# Patient Record
Sex: Female | Born: 1975 | Race: Black or African American | Hispanic: No | Marital: Married | Smoking: Never smoker
Health system: Southern US, Community
[De-identification: ages and names within clinical notes are randomized; demographics above are authoritative.]

## PROBLEM LIST (undated history)

## (undated) DIAGNOSIS — B379 Candidiasis, unspecified: Secondary | ICD-10-CM

## (undated) DIAGNOSIS — D649 Anemia, unspecified: Secondary | ICD-10-CM

## (undated) DIAGNOSIS — Z800 Family history of malignant neoplasm of digestive organs: Secondary | ICD-10-CM

## (undated) DIAGNOSIS — Z8049 Family history of malignant neoplasm of other genital organs: Secondary | ICD-10-CM

## (undated) DIAGNOSIS — K219 Gastro-esophageal reflux disease without esophagitis: Secondary | ICD-10-CM

## (undated) HISTORY — DX: Candidiasis, unspecified: B37.9

## (undated) HISTORY — DX: Anemia, unspecified: D64.9

## (undated) HISTORY — DX: Gastro-esophageal reflux disease without esophagitis: K21.9

## (undated) HISTORY — DX: Family history of malignant neoplasm of other genital organs: Z80.49

## (undated) HISTORY — DX: Family history of malignant neoplasm of digestive organs: Z80.0

---

## 1999-02-13 ENCOUNTER — Encounter: Payer: Self-pay | Admitting: Emergency Medicine

## 1999-02-13 ENCOUNTER — Emergency Department (HOSPITAL_COMMUNITY): Admission: EM | Admit: 1999-02-13 | Discharge: 1999-02-13 | Payer: Self-pay | Admitting: Emergency Medicine

## 1999-10-28 ENCOUNTER — Other Ambulatory Visit: Admission: RE | Admit: 1999-10-28 | Discharge: 1999-10-28 | Payer: Self-pay | Admitting: Obstetrics and Gynecology

## 2001-03-08 ENCOUNTER — Other Ambulatory Visit: Admission: RE | Admit: 2001-03-08 | Discharge: 2001-03-08 | Payer: Self-pay | Admitting: Obstetrics and Gynecology

## 2002-05-02 ENCOUNTER — Other Ambulatory Visit: Admission: RE | Admit: 2002-05-02 | Discharge: 2002-05-02 | Payer: Self-pay | Admitting: Obstetrics and Gynecology

## 2002-12-06 ENCOUNTER — Inpatient Hospital Stay (HOSPITAL_COMMUNITY): Admission: AD | Admit: 2002-12-06 | Discharge: 2002-12-08 | Payer: Self-pay | Admitting: Obstetrics and Gynecology

## 2002-12-25 ENCOUNTER — Encounter: Admission: RE | Admit: 2002-12-25 | Discharge: 2003-01-24 | Payer: Self-pay | Admitting: Obstetrics and Gynecology

## 2003-01-25 ENCOUNTER — Encounter: Admission: RE | Admit: 2003-01-25 | Discharge: 2003-02-24 | Payer: Self-pay | Admitting: Obstetrics and Gynecology

## 2003-03-27 ENCOUNTER — Encounter: Admission: RE | Admit: 2003-03-27 | Discharge: 2003-04-26 | Payer: Self-pay | Admitting: Obstetrics and Gynecology

## 2003-04-22 ENCOUNTER — Other Ambulatory Visit: Admission: RE | Admit: 2003-04-22 | Discharge: 2003-04-22 | Payer: Self-pay | Admitting: Obstetrics and Gynecology

## 2003-05-23 ENCOUNTER — Encounter: Admission: RE | Admit: 2003-05-23 | Discharge: 2003-06-22 | Payer: Self-pay | Admitting: Obstetrics and Gynecology

## 2003-06-23 ENCOUNTER — Encounter: Admission: RE | Admit: 2003-06-23 | Discharge: 2003-07-23 | Payer: Self-pay | Admitting: Obstetrics and Gynecology

## 2003-07-25 ENCOUNTER — Encounter: Admission: RE | Admit: 2003-07-25 | Discharge: 2003-08-24 | Payer: Self-pay | Admitting: Obstetrics and Gynecology

## 2003-08-25 ENCOUNTER — Encounter: Admission: RE | Admit: 2003-08-25 | Discharge: 2003-09-24 | Payer: Self-pay | Admitting: Obstetrics and Gynecology

## 2004-04-23 ENCOUNTER — Other Ambulatory Visit: Admission: RE | Admit: 2004-04-23 | Discharge: 2004-04-23 | Payer: Self-pay | Admitting: Obstetrics and Gynecology

## 2004-09-12 ENCOUNTER — Emergency Department (HOSPITAL_COMMUNITY): Admission: EM | Admit: 2004-09-12 | Discharge: 2004-09-12 | Payer: Self-pay | Admitting: Family Medicine

## 2005-06-15 ENCOUNTER — Other Ambulatory Visit: Admission: RE | Admit: 2005-06-15 | Discharge: 2005-06-15 | Payer: Self-pay | Admitting: Obstetrics and Gynecology

## 2009-11-20 ENCOUNTER — Inpatient Hospital Stay (HOSPITAL_COMMUNITY): Admission: AD | Admit: 2009-11-20 | Discharge: 2009-11-20 | Payer: Self-pay | Admitting: Obstetrics and Gynecology

## 2009-11-24 ENCOUNTER — Inpatient Hospital Stay (HOSPITAL_COMMUNITY): Admission: AD | Admit: 2009-11-24 | Discharge: 2009-11-24 | Payer: Self-pay | Admitting: Obstetrics and Gynecology

## 2009-12-15 ENCOUNTER — Inpatient Hospital Stay (HOSPITAL_COMMUNITY): Admission: AD | Admit: 2009-12-15 | Discharge: 2009-12-18 | Payer: Self-pay | Admitting: Obstetrics and Gynecology

## 2009-12-16 ENCOUNTER — Encounter (INDEPENDENT_AMBULATORY_CARE_PROVIDER_SITE_OTHER): Payer: Self-pay | Admitting: Obstetrics and Gynecology

## 2010-07-16 LAB — CBC
HCT: 29.7 % — ABNORMAL LOW (ref 36.0–46.0)
HCT: 33.6 % — ABNORMAL LOW (ref 36.0–46.0)
HCT: 36.9 % (ref 36.0–46.0)
Hemoglobin: 10.5 g/dL — ABNORMAL LOW (ref 12.0–15.0)
Hemoglobin: 11.4 g/dL — ABNORMAL LOW (ref 12.0–15.0)
MCH: 22.8 pg — ABNORMAL LOW (ref 26.0–34.0)
MCH: 23.1 pg — ABNORMAL LOW (ref 26.0–34.0)
MCH: 23.8 pg — ABNORMAL LOW (ref 26.0–34.0)
MCHC: 30.8 g/dL (ref 30.0–36.0)
MCHC: 31.2 g/dL (ref 30.0–36.0)
MCHC: 31.8 g/dL (ref 30.0–36.0)
MCV: 74 fL — ABNORMAL LOW (ref 78.0–100.0)
MCV: 74.1 fL — ABNORMAL LOW (ref 78.0–100.0)
Platelets: 293 10*3/uL (ref 150–400)
Platelets: 327 10*3/uL (ref 150–400)
RBC: 4.54 MIL/uL (ref 3.87–5.11)
RBC: 4.98 MIL/uL (ref 3.87–5.11)
RDW: 17.9 % — ABNORMAL HIGH (ref 11.5–15.5)
RDW: 18.4 % — ABNORMAL HIGH (ref 11.5–15.5)
RDW: 18.4 % — ABNORMAL HIGH (ref 11.5–15.5)
WBC: 11.4 10*3/uL — ABNORMAL HIGH (ref 4.0–10.5)
WBC: 8.6 10*3/uL (ref 4.0–10.5)

## 2010-07-16 LAB — RPR: RPR Ser Ql: NONREACTIVE

## 2010-07-16 LAB — DIFFERENTIAL
Basophils Absolute: 0 10*3/uL (ref 0.0–0.1)
Basophils Relative: 0 % (ref 0–1)
Eosinophils Relative: 0 % (ref 0–5)
Lymphocytes Relative: 16 % (ref 12–46)
Monocytes Absolute: 0.6 10*3/uL (ref 0.1–1.0)
Monocytes Relative: 5 % (ref 3–12)

## 2010-09-18 NOTE — H&P (Signed)
NAME:  Taylor, Vance                        ACCOUNT NO.:  [id number]   MEDICAL RECORD NO.:  [id number]                   PATIENT TYPE:  MAT   LOCATION:  [facility]                                 FACILITY:  [facility]   PHYSICIAN:  Janine Limbo, M.D.            DATE OF BIRTH:  [birth year]   DATE OF ADMISSION:  12/06/2002  DATE OF DISCHARGE:                                HISTORY & PHYSICAL   HISTORY OF PRESENT ILLNESS:  [name] is a 35 year old female, gravida 4,  para 1-0-2-1, who presents at 11 1/[redacted] weeks gestation (EDC is November 26, 2002)  for induction of labor. The patient has been followed at the [facility] and Gynecology division of [facility] for this pregnancy that has been complicated by the fact that she has  the trait for hemoglobin C. Her urinalysis has been normal throughout the  pregnancy.   OBSTETRICAL HISTORY:  In 1993, the patient had a vaginal delivery at [redacted]  weeks gestation of a 6 pound 7 ounce female infant. In 1995, the patient had a  first trimester elective pregnancy termination. In 2000, the patient had a  first trimester miscarriage.   ALLERGIES:  No known drug allergies.   PAST MEDICAL HISTORY:  The patient denies hypertension and diabetes.   REVIEW OF SYSTEMS:  Normal pregnancy complaints.   SOCIAL HISTORY:  The patient denies cigarette use, alcohol use, and  recreational drug use. The patient is a vegetarian.   FAMILY HISTORY:  Noncontributory.   PHYSICAL EXAMINATION:  VITAL SIGNS:  Weight is 211 pounds.  HEENT:  Within normal limits.  CHEST:  Clear.  HEART:  Regular rate and rhythm.  BREASTS:  Without masses.  ABDOMEN:  Gravid with a fundal height of 37 cm.  EXTREMITIES:  Within normal limits.  NEUROLOGIC:  Grossly normal.  PELVIC:  Cervix is 3 cm dilated, 75% effaced, and -2 in station. The infant  is vertex.   LABORATORY DATA:  Blood type is A+, antibody screen negative, VDRL  nonreactive, rubella  positive. HBsAg negative. Hemoglobin electrophoresis is  negative. HIV is nonreactive. Cystic fibrosis is negative. GC negative.  Chlamydia negative. Glucola screen within normal limits. Alpha-fetoprotein  screen within normal limits. Third trimester beta strep is negative. Third  trimester gonorrhea and Chlamydia are negative.   ASSESSMENT:  1. 41 1/[redacted] weeks gestation.  2. Favorable cervix.  3. Family history of hemoglobin C trait.   PLAN:  The patient will undergo induction of labor. We will start the  patient on Pitocin and then rupture her membranes.                                               Janine Limbo, M.D.    AVS/MEDQ  D:  12/05/2002  T:  12/05/2002  Job:  [id number]

## 2011-12-28 ENCOUNTER — Ambulatory Visit (INDEPENDENT_AMBULATORY_CARE_PROVIDER_SITE_OTHER): Payer: 59 | Admitting: Obstetrics and Gynecology

## 2011-12-28 ENCOUNTER — Encounter: Payer: Self-pay | Admitting: Obstetrics and Gynecology

## 2011-12-28 VITALS — BP 92/64 | Resp 16 | Ht 66.0 in | Wt 194.0 lb

## 2011-12-28 DIAGNOSIS — Z124 Encounter for screening for malignant neoplasm of cervix: Secondary | ICD-10-CM

## 2011-12-28 NOTE — Progress Notes (Signed)
Regular Periods: yes Mammogram: no  Monthly Breast Ex.: yes Exercise: yes"Jogging 3-4 miles a week"  Tetanus < 10 years: no Seatbelts: yes  NI. Bladder Functn.: yes Abuse at home: no  Daily BM's: yes Stressful Work: no  Healthy Diet: yes Sigmoid-Colonoscopy: N/A  Calcium: no Medical problems this year: Incontinence, pt states she is during [place], with sneezing she notices a little leaking.   LAST PAP:01/2010 "WNL"  Contraception: None  Mammogram:  N/A  PCP: Dr.Kimberly Renae Gloss  PMH: No Changes  FMH: No Changes  Last Bone Scan: Never  ANNUAL GYNECOLOGIC EXAMINATION   Taylor Vance is a 36 y.o. female, No obstetric history on file., who presents for an annual exam. See above. Husband had a vasectomy.  Occasional urinary incontinence.  Prior Hysterectomy: No    History   Social History  . Marital Status: Married    Spouse Name: N/A    Number of Children: N/A  . Years of Education: N/A   Social History Main Topics  . Smoking status: Never Smoker   . Smokeless tobacco: Never Used  . Alcohol Use: No  . Drug Use: No  . Sexually Active: None   Other Topics Concern  . None   Social History Narrative  . None    Menstrual cycle:   LMP: Patient's last menstrual period was 11/29/2011.             The following portions of the patient's history were reviewed and updated as appropriate: allergies, current medications, past family history, past medical history, past social history, past surgical history and problem list.  Review of Systems Pertinent items are noted in HPI. Breast:Negative for breast lump,nipple discharge or nipple retraction Gastrointestinal: Negative for abdominal pain, change in bowel habits or rectal bleeding Urinary:negative   Objective:    BP 92/64  Resp 16  Ht 5\' 6"  (1.676 m)  Wt 194 lb (87.998 kg)  BMI 31.31 kg/m2  LMP 11/29/2011    Weight:  Wt Readings from Last 1 Encounters:  12/28/11 194 lb (87.998 kg)          BMI: Body mass  index is 31.31 kg/(m^2).  General Appearance: Alert, appropriate appearance for age. No acute distress HEENT: Grossly normal Neck / Thyroid: Supple, no masses, nodes or enlargement Lungs: clear to auscultation bilaterally Back: No CVA tenderness Breast Exam: No masses or nodes.No dimpling, nipple retraction or discharge. Cardiovascular: Regular rate and rhythm. S1, S2, no murmur Gastrointestinal: Soft, non-tender, no masses or organomegaly  ++++++++++++++++++++++++++++++++++++++++++++++++++++++++  Pelvic Exam: External genitalia: normal general appearance Vaginal: normal without tenderness, induration or masses and relaxation: Yes Cervix: normal appearance Adnexa: normal bimanual exam Uterus: normal size, shape, and consistency Rectovaginal: no masses  ++++++++++++++++++++++++++++++++++++++++++++++++++++++++  Lymphatic Exam: Non-palpable nodes in neck, clavicular, axillary, or inguinal regions Neurologic: Normal speech, no tremor  Psychiatric: Alert and oriented, appropriate affect.   Wet Prep:   not applicable Urinalysis:  not applicable UPT:           Not done   Assessment:    Normal gyn exam   Overweight or obese: Yes   Pelvic relaxation: Yes  Urinary incontinence   Plan:    pap smear return annually or prn Contraception:vasectomy    Medications prescribed: none  STD screen request: No   The updated Pap smear screening guidelines were discussed with the patient. The patient requested that I obtain a Pap smear: Yes.  Kegel exercises discussed: Yes.  Proper diet and regular exercise were reviewed.  Annual mammograms  recommended starting at age 39. Proper breast care was discussed.  Screening colonoscopy is recommended beginning at age 46.  Regular health maintenance was reviewed.  Sleep hygiene was discussed.  Mylinda Latina.D.

## 2011-12-29 LAB — PAP IG W/ RFLX HPV ASCU

## 2018-02-20 ENCOUNTER — Other Ambulatory Visit: Payer: Self-pay | Admitting: Obstetrics and Gynecology

## 2018-02-20 DIAGNOSIS — N6001 Solitary cyst of right breast: Secondary | ICD-10-CM

## 2018-03-14 ENCOUNTER — Ambulatory Visit
Admission: RE | Admit: 2018-03-14 | Discharge: 2018-03-14 | Disposition: A | Discharge status: Home / Self Care | Payer: 59 | Source: Ambulatory Visit | Attending: Obstetrics and Gynecology | Admitting: Obstetrics and Gynecology

## 2018-03-14 DIAGNOSIS — N6001 Solitary cyst of right breast: Secondary | ICD-10-CM

## 2018-06-01 ENCOUNTER — Ambulatory Visit (INDEPENDENT_AMBULATORY_CARE_PROVIDER_SITE_OTHER): Payer: 59 | Admitting: Sports Medicine

## 2018-06-01 ENCOUNTER — Encounter: Payer: Self-pay | Admitting: Sports Medicine

## 2018-06-01 VITALS — BP 100/80 | HR 73 | Ht 66.0 in | Wt 206.8 lb

## 2018-06-01 DIAGNOSIS — S93492A Sprain of other ligament of left ankle, initial encounter: Secondary | ICD-10-CM

## 2018-06-01 MED ORDER — DICLOFENAC SODIUM 2 % TD SOLN: 1.0000 "application " | Freq: Two times a day (BID) | TRANSDERMAL | 2 refills | Status: DC

## 2018-06-01 MED ORDER — DICLOFENAC SODIUM 2 % TD SOLN: 1.0000 "application " | Freq: Two times a day (BID) | TRANSDERMAL | 0 refills | Status: AC

## 2018-06-01 NOTE — Progress Notes (Signed)
Taylor FellsMichael D. Delorise Shinerigby, DO  [facility] [phone]  Unknown JimMonique Danielle Vance - 43 y.o. female MRN [id number]  Date of birth: 12-19-1975  Visit Date: June 04, 2018  PCP: Taylor Vance   Referred by: Taylor Vance  SUBJECTIVE:  Chief Complaint  Patient presents with  . Left Ankle - Initial Assessment    HPI: Patient reports having an inversion ankle injury on 04/13/2018.  She has been having lateral ankle pain since that time.  Pain is located over the anterior lateral ankle.  Currently described as mild.  It is continuing to improve very slowly.  She initially had a moderate amount of swelling and bruising but this has also improved.  Is worse with prolonged standing and walking.  She is tried resting, heating, icing, elevation and over-the-counter bracing with moderate improvement but continues to have pain especially with activity.  She has not tried taking any medications on a regular basis for this.  It does occasionally awaken her at night.  No other associated falls.  And she does have some swelling but this is mild.  REVIEW OF SYSTEMS: Per HPI  Otherwise 12 point review of systems performed and is negative   HISTORY:  Prior history reviewed and updated per electronic medical record.  Social History   Occupational History  . Not on file  Tobacco Use  . Smoking status: Never Smoker  . Smokeless tobacco: Never Used  Substance and Sexual Activity  . Alcohol use: No  . Drug use: No  . Sexual activity: Not on file   Social History   Social History Narrative  . Not on file   Past Medical History:  Diagnosis Date  . Yeast infection    No past surgical history on file.   OBJECTIVE:  VS:  HT:5\' 6"  (167.6 cm)   WT:206 lb 12.8 oz (93.8 kg)  BMI:33.39    BP:100/80  HR:73bpm  TEMP: ( )  RESP:98 %   PHYSICAL EXAM: Adult female.  In no acute distress.  Alert and appropriate. Left ankle is well aligned.   Ligamentously stable to anterior drawer and talar tilting.  She does have pain however with anterior drawer and focal tenderness to palpation over the anterior lateral ankle.  Small amount of pain with Klieger testing.  Negative cotton test.  Intrinsic ankle strength and range of motion is 5+/5 although some poor proprioception appreciated. No significant lower extremity edema. DP PT pulses 2+/4.    ASSESSMENT   1. Sprain of anterior talofibular ligament of left ankle, initial encounter      PROCEDURES:  None  PLAN:  Pertinent additional documentation may be included in corresponding procedure notes, imaging studies, problem based documentation and patient instructions.  No problem-specific Assessment & Plan notes found for this encounter.  Symptoms are consistent with an inversion ankle injury with persistent slight anterior ankle impingement syndrome. Body Helix Compression Sleeve compression sleeve Home therapeutic exercises Pennsaid prescription and sample provided today.  We did discuss the option for injection therapy but she would like to try topical treatment first.   Continue with icing, cool water compression recommended.  Activity modifications and the importance of avoiding exacerbating activities (limiting pain to no more than a 4 / 10 during or following activity) recommended and discussed.  Discussed red flag symptoms that warrant earlier emergent evaluation and patient voices understanding.   Meds ordered this encounter  Medications  . Diclofenac Sodium (PENNSAID) 2 % SOLN  Sig: Place 1 application onto the skin 2 (two) times daily for 1 day.    Dispense:  8 g    Refill:  0  . Diclofenac Sodium (PENNSAID) 2 % SOLN    Sig: Place 1 application onto the skin 2 (two) times daily.    Dispense:  112 g    Refill:  2    Home Phone      [phone] Work Phone      [phone] Mobile          [phone]    Lab Orders  No laboratory test(s) ordered today    Imaging Orders  No imaging studies ordered today   Referral Orders  No referral(s) requested today   Return in about 4 weeks (around 06/29/2018).   To ensure clinical resolution.          Andrena Mews, DO    [facility]

## 2018-06-01 NOTE — Patient Instructions (Addendum)
Pennsaid instructions: You have been given a sample/prescription for Pennsaid, a topical medication.     You are to apply this gel to your injured body part twice daily (morning and evening).   A little goes a long way so you can use about a pea-sized amount for each area.   Spread this small amount over the area into a thin film and let it dry.   Be sure that you do not rub the gel into your skin for more than 10 or 15 seconds otherwise it can irritate you skin.    Once you apply the gel, please do not put any other lotion or clothing in contact with that area for 30 minutes to allow the gel to absorb into your skin.   Some people are sensitive to the medication and can develop a sunburn-like rash.  If you have only mild symptoms it is okay to continue to use the medication but if you have any breakdown of your skin you should discontinue its use and please let us know.   If you have been written a prescription for Pennsaid, you will receive a pump bottle of this topical gel through a mail order pharmacy.  The instructions on the bottle will say to apply two pumps twice a day which may be too much gel for your particular area so use the pea-sized amount as your guide.   Instructions for Duexis, Pennsaid and Vimovo:  Your prescription will be filled through a participating [facility] mail order pharmacy.  You will receive a phone call or text from one of the participating pharmacies which can be located in any state in the [place].  You must communicate directly with them to have this medication filled.  When the pharmacy contacts you, they will need your mailing address (for shipment of the medication) andy they will need payment information if you have a copay (typically no more than $10). If you have not heard from them 2-3 days after your appointment with Dr. Rigby, contact [facility] directly at [phone].   I recommend you obtained a compression sleeve to help with  your joint problems. There are many options on the market however I recommend obtaining a ankle Body Helix compression sleeve.  You can find information (including how to appropriate measure yourself for sizing) can be found at www.Body Helix.com.  Many of these products are health savings account (HSA) eligible.   You can use the compression sleeve at any time throughout the day but is most important to use while being active as well as for 2 hours post-activity.   It is appropriate to ice following activity with the compression sleeve in place.   

## 2018-06-04 ENCOUNTER — Encounter: Payer: Self-pay | Admitting: Sports Medicine

## 2018-06-04 DIAGNOSIS — S93492A Sprain of other ligament of left ankle, initial encounter: Secondary | ICD-10-CM | POA: Insufficient documentation

## 2018-06-29 ENCOUNTER — Ambulatory Visit: Payer: 59 | Admitting: Sports Medicine

## 2019-08-20 ENCOUNTER — Other Ambulatory Visit: Payer: Self-pay | Admitting: Obstetrics and Gynecology

## 2019-08-20 DIAGNOSIS — N63 Unspecified lump in unspecified breast: Secondary | ICD-10-CM

## 2019-08-30 ENCOUNTER — Other Ambulatory Visit: Payer: Self-pay

## 2019-08-30 ENCOUNTER — Ambulatory Visit
Admission: RE | Admit: 2019-08-30 | Discharge: 2019-08-30 | Disposition: A | Discharge status: Home / Self Care | Payer: 59 | Source: Ambulatory Visit | Attending: Obstetrics and Gynecology | Admitting: Obstetrics and Gynecology

## 2019-08-30 ENCOUNTER — Other Ambulatory Visit: Payer: Self-pay | Admitting: Obstetrics and Gynecology

## 2019-08-30 DIAGNOSIS — R921 Mammographic calcification found on diagnostic imaging of breast: Secondary | ICD-10-CM

## 2019-08-30 DIAGNOSIS — N63 Unspecified lump in unspecified breast: Secondary | ICD-10-CM

## 2019-09-05 ENCOUNTER — Ambulatory Visit
Admission: RE | Admit: 2019-09-05 | Discharge: 2019-09-05 | Disposition: A | Discharge status: Home / Self Care | Payer: 59 | Source: Ambulatory Visit | Attending: Obstetrics and Gynecology | Admitting: Obstetrics and Gynecology

## 2019-09-05 ENCOUNTER — Other Ambulatory Visit: Payer: Self-pay

## 2019-09-05 DIAGNOSIS — R921 Mammographic calcification found on diagnostic imaging of breast: Secondary | ICD-10-CM

## 2021-11-04 IMAGING — MG MM BREAST LOCALIZATION CLIP
4 series · 4 of 12 positions shown · non-contrast
Comparison: Previous exams.

CLINICAL DATA: Post stereotactic guided biopsy of calcifications in
the lower central to slightly inner left breast.

EXAM:
DIAGNOSTIC LEFT MAMMOGRAM POST STEREOTACTIC BIOPSY

[L CC synth-2D]
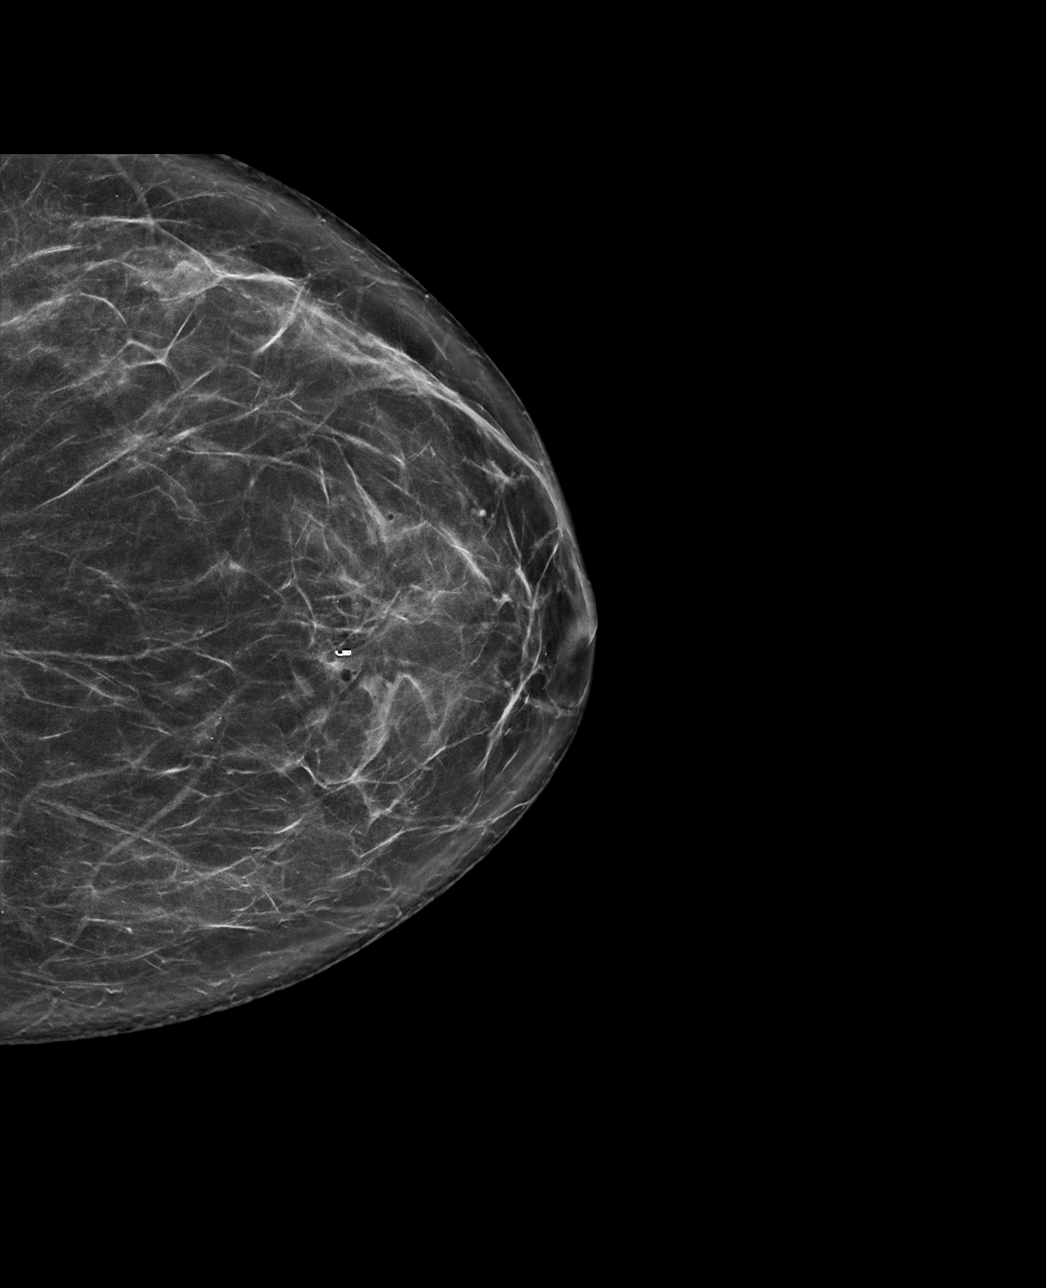

[L ML synth-2D]
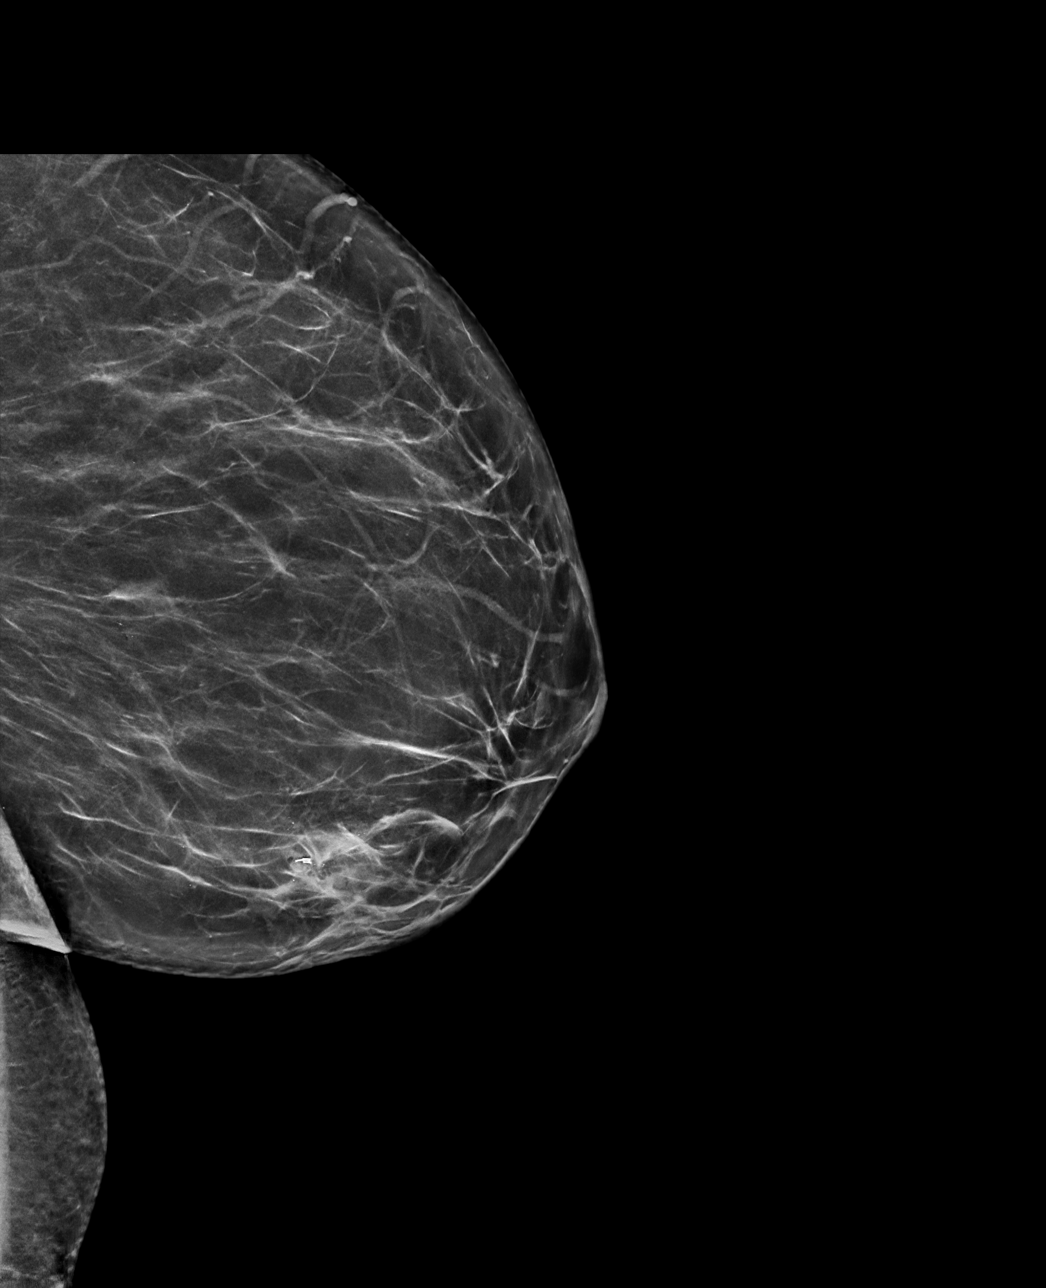

[L CC tomo · tomo slice 37/74.0]
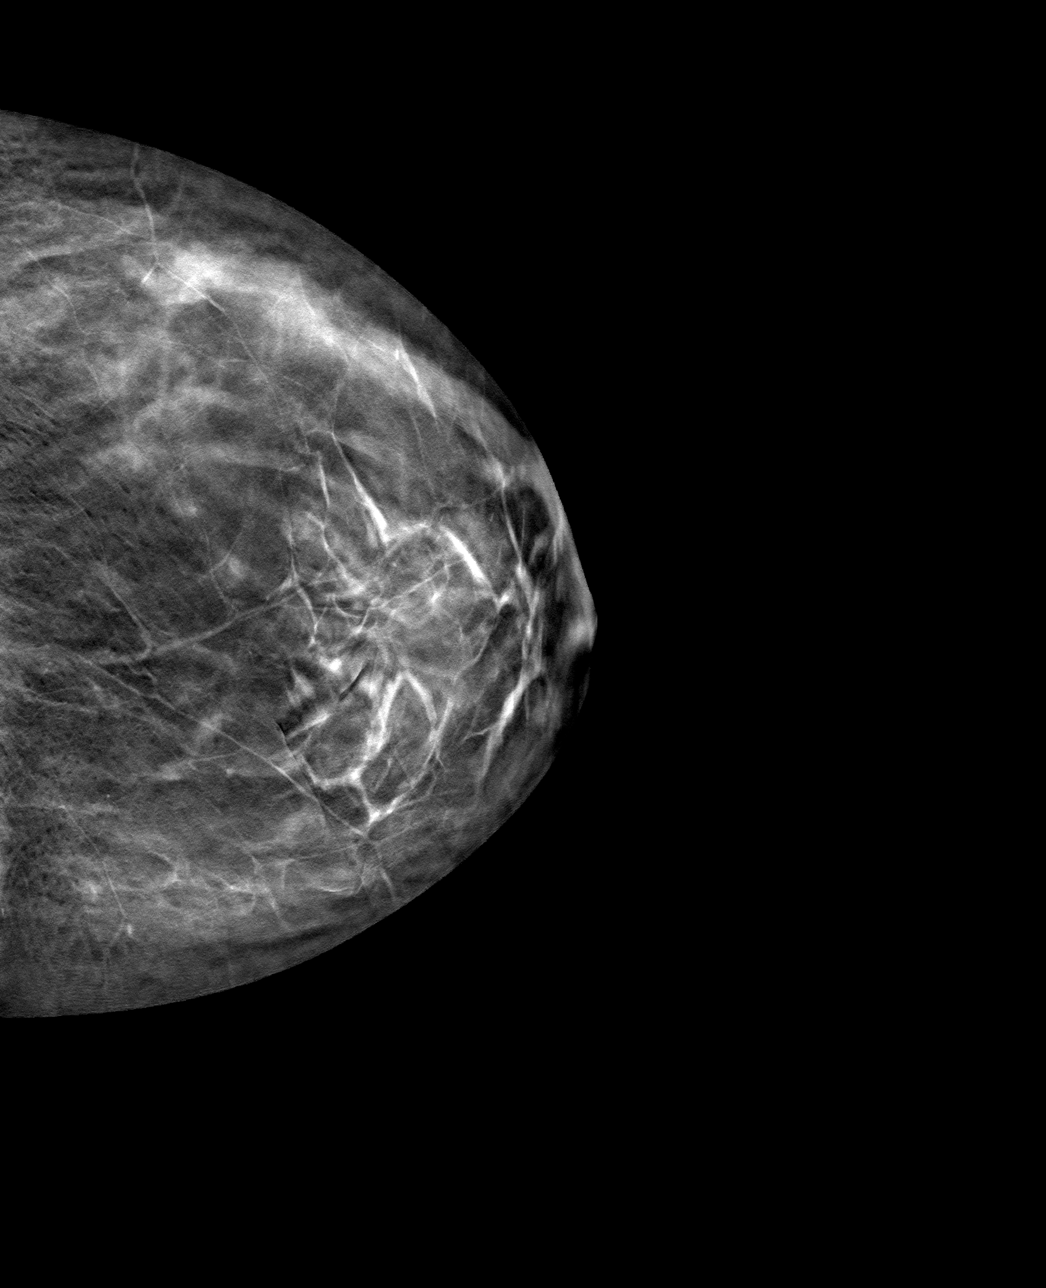

[L ML tomo · tomo slice 39/76.0]
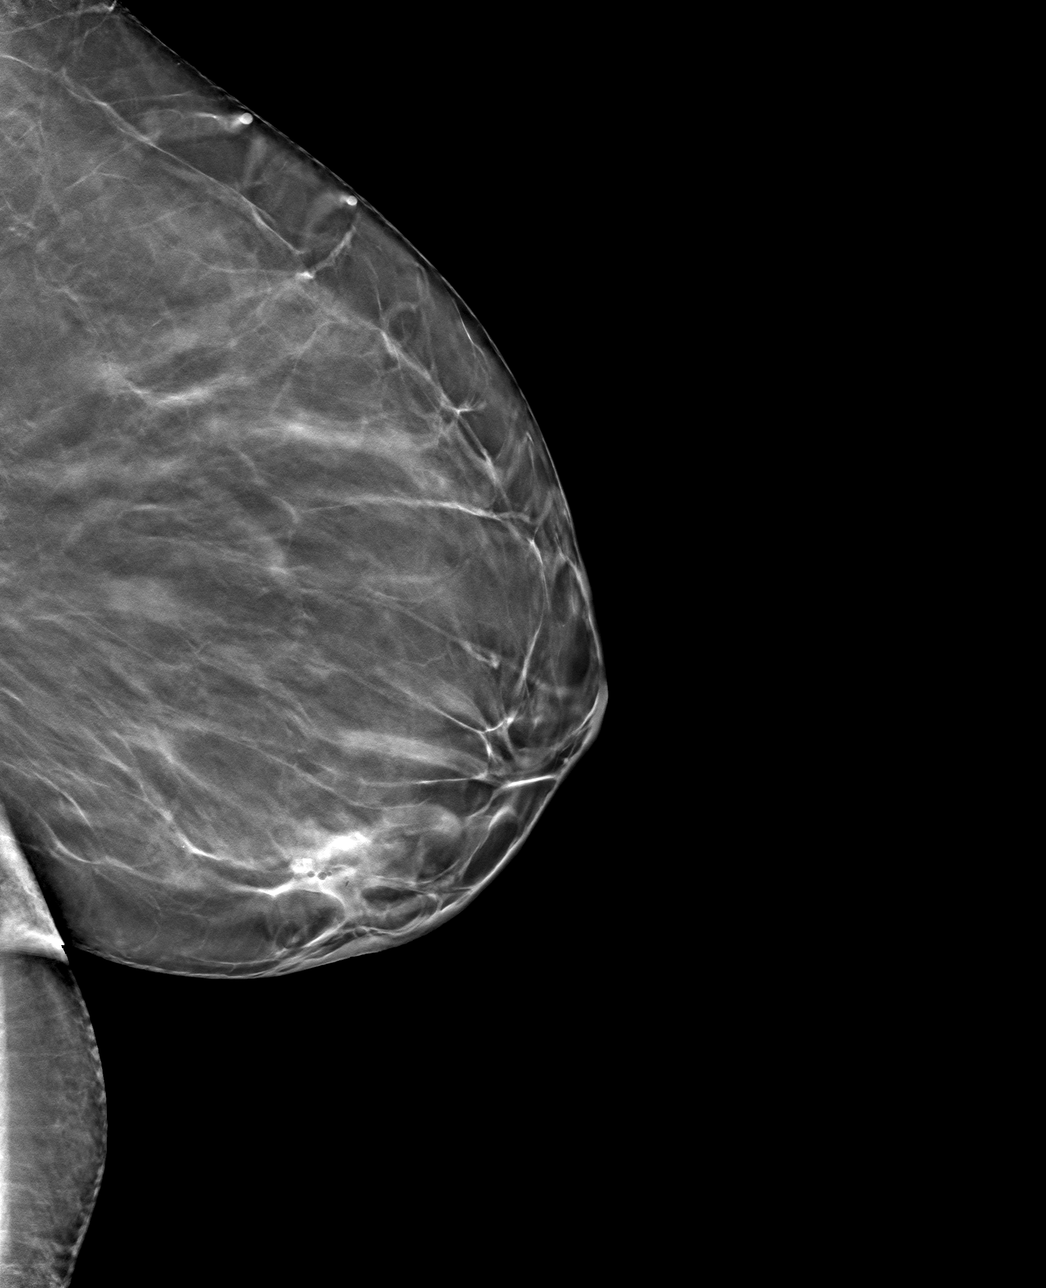

[4 of 12 positions shown; findings below may reference images not displayed]

FINDINGS: Mammographic images were obtained following stereotactic guided
biopsy of calcifications in the lower central to slightly inner left
breast. A coil shaped biopsy marking clip is present at the site of
the biopsied calcifications in the lower central to slightly inner
left breast.
IMPRESSION: 1. Coil shaped biopsy marking clip appropriately positioned at site
of biopsied calcifications in the lower central to slightly inner
left breast.

2. Additional smaller group of similar appearing calcifications
located approximately 3 cm posterior and slightly medial, best
visualized on diagnostic imaging dated 08/30/2019.

Final Assessment: Post Procedure Mammograms for Marker Placement

## 2022-07-16 ENCOUNTER — Other Ambulatory Visit: Payer: Self-pay | Admitting: Obstetrics and Gynecology

## 2022-07-16 DIAGNOSIS — N6320 Unspecified lump in the left breast, unspecified quadrant: Secondary | ICD-10-CM

## 2022-07-28 ENCOUNTER — Other Ambulatory Visit: Payer: Self-pay | Admitting: Obstetrics and Gynecology

## 2022-07-28 ENCOUNTER — Ambulatory Visit
Admission: RE | Admit: 2022-07-28 | Discharge: 2022-07-28 | Disposition: A | Discharge status: Home / Self Care | Payer: Self-pay | Source: Ambulatory Visit | Attending: Obstetrics and Gynecology | Admitting: Obstetrics and Gynecology

## 2022-07-28 DIAGNOSIS — N6320 Unspecified lump in the left breast, unspecified quadrant: Secondary | ICD-10-CM

## 2022-07-28 HISTORY — PX: BREAST BIOPSY: SHX20

## 2022-12-30 ENCOUNTER — Ambulatory Visit: Payer: BC Managed Care – PPO | Admitting: Nurse Practitioner

## 2022-12-30 ENCOUNTER — Encounter: Payer: Self-pay | Admitting: Nurse Practitioner

## 2022-12-30 VITALS — BP 110/78 | HR 85 | Temp 98.4°F | Ht 65.0 in | Wt 203.6 lb

## 2022-12-30 DIAGNOSIS — Z7689 Persons encountering health services in other specified circumstances: Secondary | ICD-10-CM

## 2022-12-30 DIAGNOSIS — Z6833 Body mass index (BMI) 33.0-33.9, adult: Secondary | ICD-10-CM | POA: Diagnosis not present

## 2022-12-30 DIAGNOSIS — Z13220 Encounter for screening for lipoid disorders: Secondary | ICD-10-CM

## 2022-12-30 DIAGNOSIS — Z2821 Immunization not carried out because of patient refusal: Secondary | ICD-10-CM | POA: Diagnosis not present

## 2022-12-30 DIAGNOSIS — E669 Obesity, unspecified: Secondary | ICD-10-CM | POA: Diagnosis not present

## 2022-12-30 DIAGNOSIS — I8390 Asymptomatic varicose veins of unspecified lower extremity: Secondary | ICD-10-CM | POA: Diagnosis not present

## 2022-12-30 NOTE — Progress Notes (Signed)
[name], CMA,acting as a Neurosurgeon for [name], FNP.,have documented all relevant documentation on the behalf of [name], FNP,as directed by  [name], FNP while in the presence of [name], FNP.  Subjective:  Patient ID: Taylor Vance , female    DOB: 22-May-1975 , 47 y.o.   MRN: [id number]  Chief Complaint  Patient presents with   Establish Care    HPI  Patient presents today to establish care, she was a patient of Dr. Renae Gloss until she left. She was seeing a provider Dr. Devra Dopp until her insurance no longer covered. Has not seen a PCP since the beginning of covid. She works as an Ecologist. Married. 78 y/o son, 70 y/o son and 35 y/o son   PMH - none  FMH - mother (triple bypass) at age 56, CHF. She does not have any swelling in her feet.   Patient would like to discuss the circulation and veins in her legs, she would have pain and cramping in her legs (calf and ankles). She travels a lot for work and has started wearing compression socks. She is not experiencing the pain as much now. She does a lot of flying.   Patient reports she recently had a stomach bug that last 4 weeks after visiting [place], she reports she still has stomach cramps after eating. She now has a decreased appetite. When she eats - she started a probiotic she has an increased amount of cramping theralack probiotic - she feels like it is effective.   Patient is establish with [facility] [place] for GYN services.       Past Medical History:  Diagnosis Date   Yeast infection      Family History  Problem Relation Age of Onset   Hyperlipidemia Mother    Heart disease Mother    Hypertension Mother    Diabetes Father    Hypertension Father    Cancer Sister      Current Outpatient Medications:    Multiple Vitamin (MULTIVITAMIN) tablet, Take 1 tablet by mouth daily., Disp: , Rfl:    No Known Allergies   Review of Systems  Constitutional:  Negative.  Negative for activity change and fatigue.  Eyes:  Negative for visual disturbance.  Respiratory: Negative.  Negative for choking, shortness of breath, wheezing and stridor.   Cardiovascular: Negative.  Negative for chest pain, palpitations and leg swelling.  Gastrointestinal: Negative.   Endocrine: Negative.  Negative for polydipsia, polyphagia and polyuria.  Musculoskeletal:  Positive for myalgias (bilateral lower legs).  Skin: Negative.   Neurological:  Negative for dizziness, weakness and headaches.  Psychiatric/Behavioral:  Negative for confusion. The patient is not nervous/anxious.      Today's Vitals   12/30/22 1547  BP: 110/78  Pulse: 85  Temp: 98.4 F (36.9 C)  TempSrc: Oral  Weight: 203 lb 9.6 oz (92.4 kg)  Height: 5\' 5"  (1.651 m)  PainSc: 0-No pain   Body mass index is 33.88 kg/m.  Wt Readings from Last 3 Encounters:  12/30/22 203 lb 9.6 oz (92.4 kg)  06/01/18 206 lb 12.8 oz (93.8 kg)  12/28/11 194 lb (88 kg)     Objective:  Physical Exam Vitals reviewed.  Constitutional:      General: She is not in acute distress.    Appearance: Normal appearance. She is well-developed. She is obese.  Cardiovascular:     Rate and Rhythm: Normal rate and regular rhythm.     Pulses: Normal pulses.  Heart sounds: Normal heart sounds. No murmur heard. Pulmonary:     Effort: Pulmonary effort is normal. No respiratory distress.     Breath sounds: Normal breath sounds. No wheezing.  Musculoskeletal:        General: No swelling, tenderness or deformity. Normal range of motion.     Right lower leg: No edema.     Left lower leg: No edema.  Skin:    General: Skin is warm and dry.     Capillary Refill: Capillary refill takes less than 2 seconds.  Neurological:     General: No focal deficit present.     Mental Status: She is alert and oriented to person, place, and time.     Cranial Nerves: No cranial nerve deficit.     Motor: No weakness.  Psychiatric:         Attention and Perception: Attention normal.        Mood and Affect: Mood normal.        Behavior: Behavior normal.        Thought Content: Thought content normal.        Cognition and Memory: Cognition normal.        Judgment: Judgment normal.         Assessment And Plan:  Establishing care with new doctor, encounter for Assessment & Plan: Patient is here to establish care. Went over patient medical, family, social and surgical history. Reviewed with patient their medications and any allergies  Reviewed with patient their sexual orientation, drug/tobacco and alcohol use Dicussed any new concerns with patient  recommended patient comes in for a physical exam and complete blood work.  Educated patient about the importance of annual screenings and immunizations.  Advised patient to eat a healthy diet along with exercise for atleast 30-45 min atleast 4-5 days of the week.     Influenza vaccination declined Assessment & Plan: Patient declined influenza vaccination at this time. Patient is aware that influenza vaccine prevents illness in 70% of healthy people, and reduces hospitalizations to 30-70% in elderly. This vaccine is recommended annually. Education has been provided regarding the importance of this vaccine but patient still declined. Advised may receive this vaccine at local pharmacy or Health Dept.or vaccine clinic. Aware to provide a copy of the vaccination record if obtained from local pharmacy or Health Dept.  Pt is willing to accept risk associated with refusing vaccination.    Varicose veins of calf Assessment & Plan: She does have noticeable varicose veins, encouraged to wear support socks if no improvement can consider referral to vein and vascular. Continue wearing support socks during the day and when traveling  Orders: -     CBC  Obesity (BMI 30-39.9) Assessment & Plan: She is encouraged to strive for BMI less than 30 to decrease cardiac risk. Advised to aim for  at least 150 minutes of exercise per week.    Orders: -     TSH -     CMP14+EGFR -     Insulin, random  Encounter for screening for lipid disorder -     Lipid panel    Return in about 3 months (around 04/01/2023) for phy when able.  Patient was given opportunity to ask questions. Patient verbalized understanding of the plan and was able to repeat key elements of the plan. All questions were answered to their satisfaction.    Jeanell Sparrow, FNP, have reviewed all documentation for this visit. The documentation on 12/2922 for the exam, diagnosis, procedures,  and orders are all accurate and complete.   IF YOU HAVE BEEN REFERRED TO A SPECIALIST, IT MAY TAKE 1-2 WEEKS TO SCHEDULE/PROCESS THE REFERRAL. IF YOU HAVE NOT HEARD FROM US/SPECIALIST IN TWO WEEKS, PLEASE GIVE [place] A CALL AT [phone].

## 2022-12-31 LAB — CBC
Hematocrit: 36 % (ref 34.0–46.6)
Hemoglobin: 10.3 g/dL — ABNORMAL LOW (ref 11.1–15.9)
MCH: 20.7 pg — ABNORMAL LOW (ref 26.6–33.0)
MCHC: 28.6 g/dL — ABNORMAL LOW (ref 31.5–35.7)
MCV: 72 fL — ABNORMAL LOW (ref 79–97)
Platelets: 490 10*3/uL — ABNORMAL HIGH (ref 150–450)
RBC: 4.97 x10E6/uL (ref 3.77–5.28)
RDW: 17.8 % — ABNORMAL HIGH (ref 11.7–15.4)
WBC: 10.3 10*3/uL (ref 3.4–10.8)

## 2022-12-31 LAB — CMP14+EGFR
ALT: 18 IU/L (ref 0–32)
AST: 23 IU/L (ref 0–40)
Albumin: 3.9 g/dL (ref 3.9–4.9)
Alkaline Phosphatase: 121 IU/L (ref 44–121)
BUN/Creatinine Ratio: 9 (ref 9–23)
BUN: 6 mg/dL (ref 6–24)
Bilirubin Total: 0.3 mg/dL (ref 0.0–1.2)
CO2: 23 mmol/L (ref 20–29)
Calcium: 9.7 mg/dL (ref 8.7–10.2)
Chloride: 102 mmol/L (ref 96–106)
Creatinine, Ser: 0.68 mg/dL (ref 0.57–1.00)
Globulin, Total: 3.1 g/dL (ref 1.5–4.5)
Glucose: 88 mg/dL (ref 70–99)
Potassium: 4 mmol/L (ref 3.5–5.2)
Sodium: 142 mmol/L (ref 134–144)
Total Protein: 7 g/dL (ref 6.0–8.5)
eGFR: 107 mL/min/{1.73_m2} (ref >59)

## 2022-12-31 LAB — TSH: TSH: 1.39 u[IU]/mL (ref 0.450–4.500)

## 2022-12-31 LAB — INSULIN, RANDOM: INSULIN: 60.2 u[IU]/mL — ABNORMAL HIGH (ref 2.6–24.9)

## 2022-12-31 LAB — LIPID PANEL
Chol/HDL Ratio: 3 ratio (ref 0.0–4.4)
Cholesterol, Total: 190 mg/dL (ref 100–199)
HDL: 64 mg/dL (ref >39)
LDL Chol Calc (NIH): 106 mg/dL — ABNORMAL HIGH (ref 0–99)
Triglycerides: 113 mg/dL (ref 0–149)
VLDL Cholesterol Cal: 20 mg/dL (ref 5–40)

## 2023-01-04 ENCOUNTER — Encounter: Payer: Self-pay | Admitting: Obstetrics and Gynecology

## 2023-01-17 DIAGNOSIS — Z13220 Encounter for screening for lipoid disorders: Secondary | ICD-10-CM | POA: Insufficient documentation

## 2023-01-17 DIAGNOSIS — Z2821 Immunization not carried out because of patient refusal: Secondary | ICD-10-CM | POA: Insufficient documentation

## 2023-01-17 DIAGNOSIS — Z7689 Persons encountering health services in other specified circumstances: Secondary | ICD-10-CM | POA: Insufficient documentation

## 2023-01-17 DIAGNOSIS — I8390 Asymptomatic varicose veins of unspecified lower extremity: Secondary | ICD-10-CM | POA: Insufficient documentation

## 2023-01-17 DIAGNOSIS — E669 Obesity, unspecified: Secondary | ICD-10-CM | POA: Insufficient documentation

## 2023-01-17 NOTE — Assessment & Plan Note (Signed)

## 2023-01-17 NOTE — Assessment & Plan Note (Signed)

## 2023-01-17 NOTE — Assessment & Plan Note (Signed)
She is encouraged to strive for BMI less than 30 to decrease cardiac risk. Advised to aim for at least 150 minutes of exercise per week.

## 2023-01-17 NOTE — Assessment & Plan Note (Signed)
She does have noticeable varicose veins, encouraged to wear support socks if no improvement can consider referral to vein and vascular. Continue wearing support socks during the day and when traveling

## 2023-04-20 ENCOUNTER — Ambulatory Visit: Payer: BC Managed Care – PPO | Admitting: Nurse Practitioner

## 2023-04-20 ENCOUNTER — Encounter: Payer: Self-pay | Admitting: Nurse Practitioner

## 2023-04-20 VITALS — BP 120/70 | HR 85 | Temp 97.9°F | Ht 65.0 in | Wt 202.0 lb

## 2023-04-20 DIAGNOSIS — Z13220 Encounter for screening for lipoid disorders: Secondary | ICD-10-CM

## 2023-04-20 DIAGNOSIS — D508 Other iron deficiency anemias: Secondary | ICD-10-CM

## 2023-04-20 DIAGNOSIS — Z0000 Encounter for general adult medical examination without abnormal findings: Secondary | ICD-10-CM

## 2023-04-20 DIAGNOSIS — E669 Obesity, unspecified: Secondary | ICD-10-CM | POA: Diagnosis not present

## 2023-04-20 DIAGNOSIS — Z2821 Immunization not carried out because of patient refusal: Secondary | ICD-10-CM

## 2023-04-20 DIAGNOSIS — E88819 Insulin resistance, unspecified: Secondary | ICD-10-CM | POA: Diagnosis not present

## 2023-04-20 DIAGNOSIS — Z809 Family history of malignant neoplasm, unspecified: Secondary | ICD-10-CM

## 2023-04-20 NOTE — Progress Notes (Signed)
Taylor Vance, CMA,acting as a Neurosurgeon for Taylor Felts, FNP.,have documented all relevant documentation on the behalf of Taylor Felts, FNP,as directed by  Taylor Felts, FNP while in the presence of Taylor Felts, FNP.  Subjective:    Patient ID: Taylor Vance , female    DOB: 12/29/1975 , 47 y.o.   MRN: [id number]  Chief Complaint  Patient presents with   Annual Exam    HPI  Patient presents today for HM, Patient reports compliance with medication. Patient denies any chest pain, SOB, or headaches. Patient has no concerns today.      Past Medical History:  Diagnosis Date   Anemia    GERD (gastroesophageal reflux disease)    Yeast infection      Family History  Problem Relation Age of Onset   Hyperlipidemia Mother    Heart disease Mother    Hypertension Mother    Alcohol abuse Mother    Diabetes Father    Hypertension Father    Alcohol abuse Father    Cancer Sister    Cancer Maternal Aunt      Current Outpatient Medications:    Multiple Vitamin (MULTIVITAMIN) tablet, Take 1 tablet by mouth daily., Disp: , Rfl:    No Known Allergies    The patient states she uses vasectomy for birth control. Patient's last menstrual period was 03/07/2023.  Negative for Dysmenorrhea and Negative for Menorrhagia. Negative for: breast discharge, breast lump(s), breast pain and breast self exam. Associated symptoms include abnormal vaginal bleeding. Pertinent negatives include abnormal bleeding (hematology), anxiety, decreased libido, depression, difficulty falling sleep, dyspareunia, history of infertility, nocturia, sexual dysfunction, sleep disturbances, urinary incontinence, urinary urgency, vaginal discharge and vaginal itching. Diet mostly healthy, she has been vegetarian since 1997, she gets her protein from nuts, tofu, legumes.  The patient states her exercise level is moderate 3 days a week 30-40 minutes.    The patient's tobacco use is:  Social History   Tobacco Use   Smoking Status Never  Smokeless Tobacco Never  . She has been exposed to passive smoke. The patient's alcohol use is:  Social History   Substance and Sexual Activity  Alcohol Use Yes   Alcohol/week: 1.0 standard drink of alcohol   Types: 1 Glasses of wine per week   Comment: occ     Review of Systems  Constitutional: Negative.   HENT: Negative.    Eyes: Negative.   Respiratory: Negative.    Cardiovascular: Negative.   Gastrointestinal: Negative.   Endocrine: Negative.   Genitourinary: Negative.   Musculoskeletal: Negative.   Skin: Negative.   Allergic/Immunologic: Negative.   Neurological: Negative.   Hematological: Negative.   Psychiatric/Behavioral: Negative.       Today's Vitals   04/20/23 1448  BP: 120/70  Pulse: 85  Temp: 97.9 F (36.6 C)  TempSrc: Oral  Weight: 202 lb (91.6 kg)  Height: 5\' 5"  (1.651 m)  PainSc: 0-No pain   Body mass index is 33.61 kg/m.  Wt Readings from Last 3 Encounters:  04/20/23 202 lb (91.6 kg)  12/30/22 203 lb 9.6 oz (92.4 kg)  06/01/18 206 lb 12.8 oz (93.8 kg)     Objective:  Physical Exam Constitutional:      General: She is not in acute distress.    Appearance: Normal appearance. She is well-developed. She is obese.  HENT:     Head: Normocephalic and atraumatic.     Right Ear: Hearing, tympanic membrane, ear canal and external ear normal. There is no  impacted cerumen.     Left Ear: Hearing, tympanic membrane, ear canal and external ear normal. There is no impacted cerumen.     Nose: Nose normal.     Mouth/Throat:     Mouth: Mucous membranes are moist.  Eyes:     General: Lids are normal.     Extraocular Movements: Extraocular movements intact.     Conjunctiva/sclera: Conjunctivae normal.     Pupils: Pupils are equal, round, and reactive to light.     Funduscopic exam:    Right eye: No papilledema.        Left eye: No papilledema.  Neck:     Thyroid: No thyroid mass.     Vascular: No carotid bruit.   Cardiovascular:     Rate and Rhythm: Normal rate and regular rhythm.     Pulses: Normal pulses.     Heart sounds: Normal heart sounds. No murmur heard. Pulmonary:     Effort: Pulmonary effort is normal.     Breath sounds: Normal breath sounds.  Chest:     Chest wall: No mass.  Breasts:    Tanner Score is 5.     Right: Normal. No mass or tenderness.     Left: Normal. No mass or tenderness.  Abdominal:     General: Abdomen is flat. Bowel sounds are normal. There is no distension.     Palpations: Abdomen is soft.     Tenderness: There is no abdominal tenderness.  Genitourinary:    Rectum: Guaiac result negative.  Musculoskeletal:        General: No swelling or tenderness. Normal range of motion.     Cervical back: Full passive range of motion without pain, normal range of motion and neck supple.     Right lower leg: No edema.     Left lower leg: No edema.  Lymphadenopathy:     Upper Body:     Right upper body: No supraclavicular, axillary or pectoral adenopathy.     Left upper body: No supraclavicular, axillary or pectoral adenopathy.  Skin:    General: Skin is warm and dry.     Capillary Refill: Capillary refill takes less than 2 seconds.  Neurological:     General: No focal deficit present.     Mental Status: She is alert and oriented to person, place, and time.     Cranial Nerves: No cranial nerve deficit.     Sensory: No sensory deficit.     Motor: No weakness.  Psychiatric:        Mood and Affect: Mood normal.        Behavior: Behavior normal.        Thought Content: Thought content normal.        Judgment: Judgment normal.         Assessment And Plan:     Encounter for annual health examination Assessment & Plan: Behavior modifications discussed and diet history reviewed.   Pt will continue to exercise regularly and modify diet with low GI, plant based foods and decrease intake of processed foods.  Recommend intake of daily multivitamin, Vitamin D, and  calcium.  Recommend mammogram and colonoscopy for preventive screenings, as well as recommend immunizations that include influenza, TDAP    COVID-19 vaccination declined Assessment & Plan: Declines covid 19 vaccine. Discussed risk of covid 31 and if she changes her mind about the vaccine to call the office. Education has been provided regarding the importance of this vaccine but patient still declined. Advised  may receive this vaccine at local pharmacy or Health Dept.or vaccine clinic. Aware to provide a copy of the vaccination record if obtained from local pharmacy or Health Dept.  Encouraged to take multivitamin, vitamin d, vitamin c and zinc to increase immune system. Aware can call office if would like to have vaccine here at office. Verbalized acceptance and understanding.     Tetanus, diphtheria, and acellular pertussis (Tdap) vaccination declined  Obesity (BMI 30-39.9) Assessment & Plan: She is encouraged to strive for BMI less than 30 to decrease cardiac risk. Advised to aim for at least 150 minutes of exercise per week.    Orders: -     TSH  Iron deficiency anemia secondary to inadequate dietary iron intake Assessment & Plan: Will check iron levels  Orders: -     CBC with Differential/Platelet -     CMP14+EGFR -     Iron, TIBC and Ferritin Panel  Insulin resistance Assessment & Plan: Will check to see if has a problem with her insulin  Orders: -     Hemoglobin A1c -     Insulin, random  Family history of cancer -     Ambulatory referral to Genetics  Encounter for screening for lipid disorder -     Lipid panel    Return for 1 year physical. Patient was given opportunity to ask questions. Patient verbalized understanding of the plan and was able to repeat key elements of the plan. All questions were answered to their satisfaction.   Taylor Felts, FNP  I, Taylor Felts, FNP, have reviewed all documentation for this visit. The documentation on 04/20/23 for the  exam, diagnosis, procedures, and orders are all accurate and complete.

## 2023-04-21 LAB — CMP14+EGFR
ALT: 17 [IU]/L (ref 0–32)
AST: 21 [IU]/L (ref 0–40)
Albumin: 4 g/dL (ref 3.9–4.9)
Alkaline Phosphatase: 115 [IU]/L (ref 44–121)
BUN/Creatinine Ratio: 9 (ref 9–23)
BUN: 6 mg/dL (ref 6–24)
Bilirubin Total: 0.2 mg/dL (ref 0.0–1.2)
CO2: 21 mmol/L (ref 20–29)
Calcium: 9.4 mg/dL (ref 8.7–10.2)
Chloride: 103 mmol/L (ref 96–106)
Creatinine, Ser: 0.64 mg/dL (ref 0.57–1.00)
Globulin, Total: 3.1 g/dL (ref 1.5–4.5)
Glucose: 80 mg/dL (ref 70–99)
Potassium: 4.1 mmol/L (ref 3.5–5.2)
Sodium: 138 mmol/L (ref 134–144)
Total Protein: 7.1 g/dL (ref 6.0–8.5)
eGFR: 109 mL/min/{1.73_m2} (ref >59)

## 2023-04-21 LAB — CBC WITH DIFFERENTIAL/PLATELET
Basophils Absolute: 0.1 10*3/uL (ref 0.0–0.2)
Basos: 1 %
EOS (ABSOLUTE): 0.2 10*3/uL (ref 0.0–0.4)
Eos: 2 %
Hematocrit: 33.9 % — ABNORMAL LOW (ref 34.0–46.6)
Hemoglobin: 10 g/dL — ABNORMAL LOW (ref 11.1–15.9)
Immature Grans (Abs): 0 10*3/uL (ref 0.0–0.1)
Immature Granulocytes: 0 %
Lymphocytes Absolute: 2.6 10*3/uL (ref 0.7–3.1)
Lymphs: 31 %
MCH: 21.6 pg — ABNORMAL LOW (ref 26.6–33.0)
MCHC: 29.5 g/dL — ABNORMAL LOW (ref 31.5–35.7)
MCV: 73 fL — ABNORMAL LOW (ref 79–97)
Monocytes Absolute: 0.6 10*3/uL (ref 0.1–0.9)
Monocytes: 7 %
Neutrophils Absolute: 5.1 10*3/uL (ref 1.4–7.0)
Neutrophils: 59 %
Platelets: 445 10*3/uL (ref 150–450)
RBC: 4.62 x10E6/uL (ref 3.77–5.28)
RDW: 16.6 % — ABNORMAL HIGH (ref 11.7–15.4)
WBC: 8.5 10*3/uL (ref 3.4–10.8)

## 2023-04-21 LAB — IRON,TIBC AND FERRITIN PANEL
Ferritin: 12 ng/mL — ABNORMAL LOW (ref 15–150)
Iron Saturation: 8 % — CL (ref 15–55)
Iron: 33 ug/dL (ref 27–159)
Total Iron Binding Capacity: 417 ug/dL (ref 250–450)
UIBC: 384 ug/dL (ref 131–425)

## 2023-04-21 LAB — LIPID PANEL
Chol/HDL Ratio: 3.1 {ratio} (ref 0.0–4.4)
Cholesterol, Total: 199 mg/dL (ref 100–199)
HDL: 64 mg/dL (ref >39)
LDL Chol Calc (NIH): 123 mg/dL — ABNORMAL HIGH (ref 0–99)
Triglycerides: 68 mg/dL (ref 0–149)
VLDL Cholesterol Cal: 12 mg/dL (ref 5–40)

## 2023-04-21 LAB — HEMOGLOBIN A1C
Est. average glucose Bld gHb Est-mCnc: 123 mg/dL
Hgb A1c MFr Bld: 5.9 % — ABNORMAL HIGH (ref 4.8–5.6)

## 2023-04-21 LAB — INSULIN, RANDOM: INSULIN: 13 u[IU]/mL (ref 2.6–24.9)

## 2023-04-21 LAB — TSH: TSH: 1.03 u[IU]/mL (ref 0.450–4.500)

## 2023-05-03 ENCOUNTER — Encounter: Payer: Self-pay | Admitting: Nurse Practitioner

## 2023-05-03 DIAGNOSIS — Z2821 Immunization not carried out because of patient refusal: Secondary | ICD-10-CM | POA: Insufficient documentation

## 2023-05-03 DIAGNOSIS — E88819 Insulin resistance, unspecified: Secondary | ICD-10-CM | POA: Insufficient documentation

## 2023-05-03 DIAGNOSIS — D508 Other iron deficiency anemias: Secondary | ICD-10-CM | POA: Insufficient documentation

## 2023-05-03 DIAGNOSIS — Z0000 Encounter for general adult medical examination without abnormal findings: Secondary | ICD-10-CM | POA: Insufficient documentation

## 2023-05-03 DIAGNOSIS — R7309 Other abnormal glucose: Secondary | ICD-10-CM | POA: Insufficient documentation

## 2023-05-03 MED ORDER — FERROUS SULFATE 325 (65 FE) MG PO TBEC: 325.0000 mg | DELAYED_RELEASE_TABLET | Freq: Two times a day (BID) | ORAL | 3 refills | Status: DC

## 2023-05-03 NOTE — Assessment & Plan Note (Signed)
Will check iron levels  

## 2023-05-03 NOTE — Assessment & Plan Note (Signed)
Will check to see if has a problem with her insulin

## 2023-05-03 NOTE — Assessment & Plan Note (Signed)

## 2023-05-03 NOTE — Assessment & Plan Note (Signed)

## 2023-05-03 NOTE — Assessment & Plan Note (Signed)
 She is encouraged to strive for BMI less than 30 to decrease cardiac risk. Advised to aim for at least 150 minutes of exercise per week.

## 2023-05-04 ENCOUNTER — Encounter: Payer: Self-pay | Admitting: Nurse Practitioner

## 2023-05-06 ENCOUNTER — Telehealth: Payer: Self-pay | Admitting: Genetic Counselor

## 2023-05-06 NOTE — Telephone Encounter (Signed)
Scheduled appointment per referral. Patient is aware of the made appointments.

## 2023-05-09 ENCOUNTER — Other Ambulatory Visit: Payer: Self-pay | Admitting: Nurse Practitioner

## 2023-05-09 DIAGNOSIS — D508 Other iron deficiency anemias: Secondary | ICD-10-CM

## 2023-06-27 NOTE — Progress Notes (Unsigned)
 [facility]   [phone] [phone]   Clinic New consult Note   Patient Care Team: [name], FNP as PCP - General (General Practice) 06/28/2023  CHIEF COMPLAINTS/PURPOSE OF CONSULTATION:  Iron deficiency anemia    HISTORY OF PRESENTING ILLNESS:  Taylor Vance 48 y.o. female is here because of  anemia.  She was referred by primary care after labs done for routine physical showed iron deficiency anemia.  Labs done 04/20/2023 are significant for ferritin level of 12 and iron saturation of 8.  Her hemoglobin was 10.0 with hematocrit of 33.9.  Her MCV was 73.  Her platelet count was normal at 445.  It appears that patient's history of iron deficiency anemia goes back at least 13 years.  She reported heavy menstrual periods to her primary care.  She does have GYN provider that she sees routinely.  She did have colonoscopy in April 2022.  She had 1 sessile polyp with the remainder of exam normal.  Recall is expected in 10 years. Essentially, the patient is married and has 3 children.  She lives with her husband and youngest son who is 41.  She denies smoking.  Will drink wine approximately twice per month.  She denies recreational or use of nonprescribed medications.  She has no personal history of cancer but states she has had iron deficiency since approximately age 18.  She has never had a blood transfusion.  She has not had iron infusions in the past.  Has been treated with oral iron in the past, during all 3 of her pregnancies.  She states that her sister had been diagnosed with endometrial cancer.  Her maternal aunt has history of pancreatic cancer and her paternal aunt had colon cancer.  [name] does have an upcoming appointment with genetic counseling in March 2025. Today, the patient reports to the clinic alone. She states she does feel chronic fatigue.  She is able to participate in normal activities but feels as though she is moving slower.   She works full-time.  Is able to perform her job.  Has noticed some mental "fogginess" in recent months.  Patient reports being vegetarian but does eat a well-balanced diet.  Sometimes, she does crave ice.  She has also noticed some dizziness.  She states she has not noticed much of a difference since starting oral iron a few weeks ago.  She does take liquid form of oral iron as iron tablets cause her to have significant constipation.  She was found to have abnormal CBC from 04/2023 She denies recent chest pain on exertion, shortness of breath on minimal exertion, pre-syncopal episodes, or palpitations. She had not noticed any recent bleeding such as epistaxis, hematuria or hematochezia The patient states that she does take OTC ibuprofen on as needed basis. . She is not  on antiplatelets agents. Her last colonoscopy was 08/2020 She had no prior history or diagnosis of cancer. Her age appropriate screening programs are up-to-date. She denies any pica and eats a variety of diet. She is vegetarian, but gets eats an abundance of iron rich foods.  She never donated blood or received blood transfusion The patient was prescribed oral iron supplements and she takes oral liquid iron.    REVIEW OF SYSTEMS:   Constitutional: Denies fevers, chills or abnormal night sweats.  Moderate fatigue. Eyes: Denies blurriness of vision, double vision or watery eyes Ears, nose, mouth, throat, and face: Denies mucositis or sore throat Respiratory: Denies cough, dyspnea or  wheezes Cardiovascular: Denies palpitation, chest discomfort or lower extremity swelling Gastrointestinal:  Denies nausea, heartburn or change in bowel habits Skin: Denies abnormal skin rashes Lymphatics: Denies new lymphadenopathy or easy bruising Neurological:Denies numbness, tingling or new weaknesses.  Occasional episodes of dizziness. Behavioral/Psych: Mood is stable, no new changes   All other systems were reviewed with the patient and are  negative.   MEDICAL HISTORY:  Past Medical History:  Diagnosis Date   Anemia    GERD (gastroesophageal reflux disease)    Yeast infection     SURGICAL HISTORY: Past Surgical History:  Procedure Laterality Date   BREAST BIOPSY Left 07/28/2022   [place] LT BREAST BX W LOC DEV 1ST LESION IMG BX SPEC US GUIDE 07/28/2022 [facility]    SOCIAL HISTORY: Social History   Socioeconomic History   Marital status: Married    Spouse name: Not on file   Number of children: 3   Years of education: Not on file   Highest education level: Master's degree (e.g., MA, MS, MEng, MEd, MSW, MBA)  Occupational History   Not on file  Tobacco Use   Smoking status: Never   Smokeless tobacco: Never  Substance and Sexual Activity   Alcohol use: Yes    Alcohol/week: 1.0 standard drink of alcohol    Types: 1 Glasses of wine per week    Comment: occ   Drug use: No   Sexual activity: Yes    Birth control/protection: Other-see comments, None    Comment: husband vasectomy.  Other Topics Concern   Not on file  Social History Narrative   Not on file   Social Drivers of Health   Financial Resource Strain: Low Risk  (04/19/2023)   Overall Financial Resource Strain (CARDIA)    Difficulty of Paying Living Expenses: Not hard at all  Food Insecurity: No Food Insecurity (06/28/2023)   Hunger Vital Sign    Worried About Running Out of Food in the Last Year: Never true    Ran Out of Food in the Last Year: Never true  Transportation Needs: No Transportation Needs (06/28/2023)   PRAPARE - Administrator, Civil Service (Medical): No    Lack of Transportation (Non-Medical): No  Physical Activity: Insufficiently Active (04/19/2023)   Exercise Vital Sign    Days of Exercise per Week: 3 days    Minutes of Exercise per Session: 30 min  Stress: No Stress Concern Present (04/19/2023)   [employer] of Occupational Health - Occupational Stress Questionnaire    Feeling of Stress : Only a little   Social Connections: Socially Integrated (04/19/2023)   Social Connection and Isolation Panel [NHANES]    Frequency of Communication with Friends and Family: More than three times a week    Frequency of Social Gatherings with Friends and Family: More than three times a week    Attends Religious Services: 1 to 4 times per year    Active Member of [employer] or Organizations: Yes    Attends Banker Meetings: More than 4 times per year    Marital Status: Married  Catering manager Violence: Unknown (12/12/2022)   Received from [facility]   HITS    Physically Hurt: Not on file    Insult or Talk Down To: Not on file    Threaten Physical Harm: Not on file    Scream or Curse: Not on file    FAMILY HISTORY: Family History  Problem Relation Age of Onset   Hyperlipidemia Mother  Heart disease Mother    Hypertension Mother    Alcohol abuse Mother    Diabetes Father    Hypertension Father    Alcohol abuse Father    Cancer Sister    Cancer Maternal Aunt     ALLERGIES:  has no known allergies.  MEDICATIONS:  Current Outpatient Medications  Medication Sig Dispense Refill   ferrous sulfate 325 (65 FE) MG EC tablet Take 1 tablet (325 mg total) by mouth 2 (two) times daily. 60 tablet 3   Multiple Vitamin (MULTIVITAMIN) tablet Take 1 tablet by mouth daily.     No current facility-administered medications for this visit.    PHYSICAL EXAMINATION: ECOG PERFORMANCE STATUS: 1 - Symptomatic but completely ambulatory  Vitals:   06/28/23 1421  BP: 138/78  Pulse: 67  Resp: 20  Temp: 97.6 F (36.4 C)  SpO2: 99%   Filed Weights   06/28/23 1421  Weight: 216 lb 1.6 oz (98 kg)    GENERAL:alert, no distress and comfortable SKIN: skin color, texture, turgor are normal, no rashes or significant lesions EYES: normal, conjunctiva are pink and non-injected, sclera clear OROPHARYNX:no exudate, no erythema and lips, buccal mucosa, and tongue normal  NECK: supple, thyroid normal  size, non-tender, without nodularity LYMPH:  no palpable lymphadenopathy in the cervical, axillary or inguinal LUNGS: clear to auscultation and percussion with normal breathing effort HEART: regular rate & rhythm and no murmurs and no lower extremity edema ABDOMEN:abdomen soft, non-tender and normal bowel sounds Musculoskeletal:no cyanosis of digits and no clubbing  PSYCH: alert & oriented x 3 with fluent speech NEURO: no focal motor/sensory deficits  LABORATORY DATA:  I have reviewed the data as listed    Latest Ref Rng & Units 06/28/2023    3:35 PM 04/20/2023    3:40 PM 12/30/2022    4:40 PM  CBC  WBC 4.0 - 10.5 K/uL 8.2  8.5  10.3   Hemoglobin 12.0 - 15.0 g/dL 16.1  09.6  04.5   Hematocrit 36.0 - 46.0 % 36.9  33.9  36.0   Platelets 150 - 400 K/uL 453  445  490        Latest Ref Rng & Units 04/20/2023    3:40 PM 12/30/2022    4:40 PM  CMP  Glucose 70 - 99 mg/dL 80  88   BUN 6 - 24 mg/dL 6  6   Creatinine 4.09 - 1.00 mg/dL 8.11  9.14   Sodium 782 - 144 mmol/L 138  142   Potassium 3.5 - 5.2 mmol/L 4.1  4.0   Chloride 96 - 106 mmol/L 103  102   CO2 20 - 29 mmol/L 21  23   Calcium 8.7 - 10.2 mg/dL 9.4  9.7   Total Protein 6.0 - 8.5 g/dL 7.1  7.0   Total Bilirubin 0.0 - 1.2 mg/dL 0.2  0.3   Alkaline Phos 44 - 121 IU/L 115  121   AST 0 - 40 IU/L 21  23   ALT 0 - 32 IU/L 17  18    Assessment and plan: Iron deficiency anemia secondary to inadequate dietary iron intake Assessment & Plan: Labs done 04/20/2023 are significant for ferritin level of 12 and iron saturation of 8.  Her hemoglobin was 10.0 with hematocrit of 33.9.  Her MCV was 73.  Her platelet count was normal at 445.  It appears that patient's history of iron deficiency anemia goes back at least 13 years.  She reported heavy menstrual periods to her  primary care.  She does have GYN provider that she sees routinely.  She did have colonoscopy in April 2022.  She had 1 sessile polyp with the remainder of exam normal.   Recall is expected in 10 years. Currently taking liquid form of oral iron.  States iron supplements in pill form causes her significant constipation. Today, plan to check blood count along with iron panel.  Discussed potential need for IV iron as well as risk factors and possible side effects associated with receiving IV iron. Side effects can be mild, including rash and itchiness, or severe, including anaphylaxis. Risks of side effects are low and often, medications such as Tylenol and Benadryl are given to help prevent unwanted side effects.  The patient voiced understanding and willingness to proceed with testing today.  Will follow-up with her once lab results are back and determine recommended course for further treatment and return visit.  Orders: -     Vitamin B12; Future -     Folate; Future -     CMP ([facility]cer Center only); Future -     Reticulocytes; Future -     Iron and Iron Binding Capacity ([facility] only); Future -     Ferritin; Future -     CBC with Differential ([facility] Only); Future     Orders Placed This Encounter  Procedures   Vitamin B12    Standing Status:   Future    Number of Occurrences:   1    Expected Date:   06/28/2023    Expiration Date:   06/27/2024   Folate    Standing Status:   Future    Number of Occurrences:   1    Expected Date:   06/28/2023    Expiration Date:   06/27/2024   CMP (Cancer Center only)    Standing Status:   Future    Number of Occurrences:   1    Expected Date:   06/28/2023    Expiration Date:   06/27/2024   Reticulocytes    Standing Status:   Future    Number of Occurrences:   1    Expected Date:   06/28/2023    Expiration Date:   06/27/2024   Iron and Iron Binding Capacity ([facility] only)    Standing Status:   Future    Number of Occurrences:   1    Expected Date:   06/28/2023    Expiration Date:   06/27/2024   Ferritin    Standing Status:   Future    Number of Occurrences:   1    Expected Date:   06/28/2023    Expiration Date:    06/27/2024   CBC with Differential (Cancer Center Only)    Standing Status:   Future    Number of Occurrences:   1    Expected Date:   06/28/2023    Expiration Date:   06/27/2024    The patient was seen along with Dr. Mosetta Putt. All questions were answered. The patient knows to call the clinic with any problems, questions or concerns. The total time spent in the appointment was 25 minutes.     Carlean Jews, NP 06/28/2023 4:12 PM  I

## 2023-06-28 ENCOUNTER — Inpatient Hospital Stay: Payer: Self-pay | Attending: Nurse Practitioner | Admitting: Nurse Practitioner

## 2023-06-28 ENCOUNTER — Inpatient Hospital Stay: Payer: Self-pay

## 2023-06-28 ENCOUNTER — Encounter: Payer: Self-pay | Admitting: Nurse Practitioner

## 2023-06-28 VITALS — BP 138/78 | HR 67 | Temp 97.6°F | Resp 20 | Wt 216.1 lb

## 2023-06-28 DIAGNOSIS — D509 Iron deficiency anemia, unspecified: Secondary | ICD-10-CM | POA: Diagnosis present

## 2023-06-28 DIAGNOSIS — Z800 Family history of malignant neoplasm of digestive organs: Secondary | ICD-10-CM | POA: Insufficient documentation

## 2023-06-28 DIAGNOSIS — R5382 Chronic fatigue, unspecified: Secondary | ICD-10-CM | POA: Insufficient documentation

## 2023-06-28 DIAGNOSIS — N920 Excessive and frequent menstruation with regular cycle: Secondary | ICD-10-CM | POA: Diagnosis present

## 2023-06-28 DIAGNOSIS — D508 Other iron deficiency anemias: Secondary | ICD-10-CM

## 2023-06-28 DIAGNOSIS — Z808 Family history of malignant neoplasm of other organs or systems: Secondary | ICD-10-CM | POA: Diagnosis not present

## 2023-06-28 LAB — RETICULOCYTES
Immature Retic Fract: 36.1 % — ABNORMAL HIGH (ref 2.3–15.9)
RBC.: 5.14 MIL/uL — ABNORMAL HIGH (ref 3.87–5.11)
Retic Count, Absolute: 70.9 10*3/uL (ref 19.0–186.0)
Retic Ct Pct: 1.4 % (ref 0.4–3.1)

## 2023-06-28 LAB — CMP (CANCER CENTER ONLY)
ALT: 17 U/L (ref 0–44)
AST: 21 U/L (ref 15–41)
Albumin: 4.3 g/dL (ref 3.5–5.0)
Alkaline Phosphatase: 114 U/L (ref 38–126)
Anion gap: 7 (ref 5–15)
BUN: 8 mg/dL (ref 6–20)
CO2: 29 mmol/L (ref 22–32)
Calcium: 9.5 mg/dL (ref 8.9–10.3)
Chloride: 102 mmol/L (ref 98–111)
Creatinine: 0.73 mg/dL (ref 0.44–1.00)
GFR, Estimated: >60 mL/min (ref >60)
Glucose, Bld: 83 mg/dL (ref 70–99)
Potassium: 3.6 mmol/L (ref 3.5–5.1)
Sodium: 138 mmol/L (ref 135–145)
Total Bilirubin: 0.3 mg/dL (ref 0.0–1.2)
Total Protein: 8 g/dL (ref 6.5–8.1)

## 2023-06-28 LAB — CBC WITH DIFFERENTIAL (CANCER CENTER ONLY)
Abs Immature Granulocytes: 0.01 10*3/uL (ref 0.00–0.07)
Basophils Absolute: 0.1 10*3/uL (ref 0.0–0.1)
Basophils Relative: 1 %
Eosinophils Absolute: 0.1 10*3/uL (ref 0.0–0.5)
Eosinophils Relative: 2 %
HCT: 36.9 % (ref 36.0–46.0)
Hemoglobin: 11.2 g/dL — ABNORMAL LOW (ref 12.0–15.0)
Immature Granulocytes: 0 %
Lymphocytes Relative: 34 %
Lymphs Abs: 2.8 10*3/uL (ref 0.7–4.0)
MCH: 21.7 pg — ABNORMAL LOW (ref 26.0–34.0)
MCHC: 30.4 g/dL (ref 30.0–36.0)
MCV: 71.4 fL — ABNORMAL LOW (ref 80.0–100.0)
Monocytes Absolute: 0.4 10*3/uL (ref 0.1–1.0)
Monocytes Relative: 5 %
Neutro Abs: 4.7 10*3/uL (ref 1.7–7.7)
Neutrophils Relative %: 58 %
Platelet Count: 453 10*3/uL — ABNORMAL HIGH (ref 150–400)
RBC: 5.17 MIL/uL — ABNORMAL HIGH (ref 3.87–5.11)
RDW: 18.5 % — ABNORMAL HIGH (ref 11.5–15.5)
WBC Count: 8.2 10*3/uL (ref 4.0–10.5)
nRBC: 0 % (ref 0.0–0.2)

## 2023-06-28 LAB — VITAMIN B12: Vitamin B-12: 262 pg/mL (ref 180–914)

## 2023-06-28 LAB — IRON AND IRON BINDING CAPACITY (CC-WL,HP ONLY)
Iron: 31 ug/dL (ref 28–170)
Saturation Ratios: 6 % — ABNORMAL LOW (ref 10.4–31.8)
TIBC: 524 ug/dL — ABNORMAL HIGH (ref 250–450)
UIBC: 493 ug/dL — ABNORMAL HIGH (ref 148–442)

## 2023-06-28 LAB — FOLATE: Folate: 20.5 ng/mL (ref >5.9)

## 2023-06-28 NOTE — Assessment & Plan Note (Signed)
 Labs done 04/20/2023 are significant for ferritin level of 12 and iron saturation of 8.  Her hemoglobin was 10.0 with hematocrit of 33.9.  Her MCV was 73.  Her platelet count was normal at 445.  It appears that patient's history of iron deficiency anemia goes back at least 13 years.  She reported heavy menstrual periods to her primary care.  She does have GYN provider that she sees routinely.  She did have colonoscopy in April 2022.  She had 1 sessile polyp with the remainder of exam normal.  Recall is expected in 10 years. Currently taking liquid form of oral iron.  States iron supplements in pill form causes her significant constipation. Today, plan to check blood count along with iron panel.  Discussed potential need for IV iron as well as risk factors and possible side effects associated with receiving IV iron. Side effects can be mild, including rash and itchiness, or severe, including anaphylaxis. Risks of side effects are low and often, medications such as Tylenol and Benadryl are given to help prevent unwanted side effects.  The patient voiced understanding and willingness to proceed with testing today.  Will follow-up with her once lab results are back and determine recommended course for further treatment and return visit.

## 2023-06-29 LAB — FERRITIN: Ferritin: 8 ng/mL — ABNORMAL LOW (ref 11–307)

## 2023-07-04 ENCOUNTER — Telehealth: Payer: Self-pay

## 2023-07-04 ENCOUNTER — Telehealth: Payer: Self-pay | Admitting: Nurse Practitioner

## 2023-07-04 ENCOUNTER — Other Ambulatory Visit: Payer: Self-pay | Admitting: Nurse Practitioner

## 2023-07-04 ENCOUNTER — Other Ambulatory Visit: Payer: Self-pay

## 2023-07-04 DIAGNOSIS — D649 Anemia, unspecified: Secondary | ICD-10-CM | POA: Insufficient documentation

## 2023-07-04 NOTE — Telephone Encounter (Signed)
 Spoke with patient via telephone call, per Amie Portland. let the patient know that, even though her hemoglobin and hematocrit looked a bit better since starting on oral iron, It is still rather low.  Let the patient know that Herbert Seta would like her to start IV iron with Venofer.  Let patient know that Amie Portland has sent orders and treatment plan to [facility].  Let patient know that [facility] will call her to schedule these infusions for her. Also let patient know that Amie Portland wants her to schedule a follow up with labs in approximately 2 to 3 months. Let patient know that someone from our scheduling team will be contacting her by telephone to schedule f/u with lab. Patient verbalized understanding.

## 2023-07-04 NOTE — Telephone Encounter (Signed)
 Patient is aware of scheduled appointment times/dates

## 2023-07-05 ENCOUNTER — Encounter: Payer: Self-pay | Admitting: Nurse Practitioner

## 2023-07-05 ENCOUNTER — Telehealth: Payer: Self-pay

## 2023-07-05 NOTE — Telephone Encounter (Signed)
 Taylor Vance, patient will be scheduled as soon as possible.  Auth Submission: NO AUTH NEEDED Site of care: Site of care: [facility] Payer: [employer] state health  Medication & CPT/J Code(s) submitted: Venofer (Iron Sucrose) J1756 Route of submission (phone, fax, portal):  Phone # Fax # Auth type: Buy/Bill PB Units/visits requested: 200mg  x 5 doses Reference number:  Approval from: 07/05/23 to 01/05/24

## 2023-07-14 ENCOUNTER — Ambulatory Visit

## 2023-07-14 VITALS — BP 137/82 | HR 82 | Temp 98.2°F | Resp 20 | Ht 66.0 in | Wt 211.2 lb

## 2023-07-14 DIAGNOSIS — D649 Anemia, unspecified: Secondary | ICD-10-CM | POA: Diagnosis not present

## 2023-07-14 MED ORDER — ACETAMINOPHEN 325 MG PO TABS
650.0000 mg | ORAL_TABLET | Freq: Once | ORAL | Status: AC
  Administered 2023-07-14: 650 mg via ORAL
  Filled 2023-07-14: qty 2

## 2023-07-14 MED ORDER — DIPHENHYDRAMINE HCL 25 MG PO CAPS
25.0000 mg | ORAL_CAPSULE | Freq: Once | ORAL | Status: AC
  Administered 2023-07-14: 25 mg via ORAL
  Filled 2023-07-14: qty 1

## 2023-07-14 MED ORDER — IRON SUCROSE 20 MG/ML IV SOLN
200.0000 mg | Freq: Once | INTRAVENOUS | Status: AC
  Administered 2023-07-14: 200 mg via INTRAVENOUS
  Filled 2023-07-14: qty 10

## 2023-07-14 NOTE — Progress Notes (Signed)
 Diagnosis: Iron Deficiency Anemia  Provider:  Chilton Greathouse MD  Procedure: IV Push  IV Type: Peripheral, IV Location: R Antecubital  Venofer (Iron Sucrose), Dose: 200 mg  Post Infusion IV Care: Observation period completed and Peripheral IV Discontinued  Discharge: Condition: Good, Destination: Home . AVS Declined  Performed by:  Rico Ala, LPN

## 2023-07-18 ENCOUNTER — Encounter: Payer: Self-pay | Admitting: Nurse Practitioner

## 2023-07-21 ENCOUNTER — Ambulatory Visit

## 2023-07-21 VITALS — BP 121/79 | HR 66 | Temp 98.2°F | Resp 16 | Ht 66.0 in | Wt 212.0 lb

## 2023-07-21 DIAGNOSIS — D649 Anemia, unspecified: Secondary | ICD-10-CM | POA: Diagnosis not present

## 2023-07-21 MED ORDER — IRON SUCROSE 20 MG/ML IV SOLN
200.0000 mg | Freq: Once | INTRAVENOUS | Status: AC
  Administered 2023-07-21: 200 mg via INTRAVENOUS
  Filled 2023-07-21: qty 10

## 2023-07-21 MED ORDER — ACETAMINOPHEN 325 MG PO TABS
650.0000 mg | ORAL_TABLET | Freq: Once | ORAL | Status: AC
Start: 2023-07-21 — End: 2023-07-21
  Administered 2023-07-21: 650 mg via ORAL
  Filled 2023-07-21: qty 2

## 2023-07-21 MED ORDER — DIPHENHYDRAMINE HCL 25 MG PO CAPS: 25.0000 mg | ORAL_CAPSULE | Freq: Once | ORAL | Status: DC

## 2023-07-21 NOTE — Progress Notes (Signed)
Diagnosis: Iron Deficiency Anemia  Provider:  Chilton Greathouse MD  Procedure: IV Push  IV Type: Peripheral, IV Location: R Forearm  Venofer (Iron Sucrose), Dose: 200 mg  Post Infusion IV Care: Observation period completed and Peripheral IV Discontinued  Discharge: Condition: Good, Destination: Home . AVS Declined  Performed by:  Rico Ala, LPN

## 2023-07-26 ENCOUNTER — Encounter: Payer: Self-pay | Admitting: Genetic Counselor

## 2023-07-26 ENCOUNTER — Inpatient Hospital Stay: Payer: Self-pay | Attending: Nurse Practitioner | Admitting: Genetic Counselor

## 2023-07-26 ENCOUNTER — Other Ambulatory Visit: Payer: Self-pay | Admitting: Genetic Counselor

## 2023-07-26 ENCOUNTER — Inpatient Hospital Stay: Payer: Self-pay

## 2023-07-26 DIAGNOSIS — Z800 Family history of malignant neoplasm of digestive organs: Secondary | ICD-10-CM

## 2023-07-26 DIAGNOSIS — Z8049 Family history of malignant neoplasm of other genital organs: Secondary | ICD-10-CM

## 2023-07-26 DIAGNOSIS — Z8042 Family history of malignant neoplasm of prostate: Secondary | ICD-10-CM | POA: Diagnosis not present

## 2023-07-26 LAB — GENETIC SCREENING ORDER

## 2023-07-26 NOTE — Progress Notes (Signed)
 REFERRING PROVIDER: [name], FNP [address] [place],  [place] [address]  PRIMARY PROVIDER:  [name], FNP  PRIMARY REASON FOR VISIT:  1. Family history of colon cancer   2. Family history of pancreatic cancer   3. Family history of uterine cancer      HISTORY OF PRESENT ILLNESS:   Taylor Vance, a 48 y.o. female, was seen for a [facility] cancer genetics consultation at the request of Marianne Sofia, FNP due to a family history of cancer.  Taylor Vance presents to clinic today to discuss the possibility of a hereditary predisposition to cancer, genetic testing, and to further clarify her future cancer risks, as well as potential cancer risks for family members.   Taylor Vance is a 48 y.o. female with no personal history of cancer.  Her sister had uterine cancer and underwent genetic testing.  We do not have a copy of that test, but reportedly her sister's doctor said that family members needed to undergo genetic testing.  CANCER HISTORY:  Oncology History   No history exists.     RISK FACTORS:  Menarche was at age 85.  First live birth at age 16.  OCP use for approximately 0 years.  Ovaries intact: yes.  Hysterectomy: no.  Menopausal status: perimenopausal.  HRT use: 0 years. Colonoscopy: yes; normal. Mammogram within the last year: yes. Number of breast biopsies: 2. Up to date with pelvic exams: yes. Any excessive radiation exposure in the past: no  Past Medical History:  Diagnosis Date   Anemia    Family history of colon cancer    Family history of pancreatic cancer    Family history of uterine cancer    GERD (gastroesophageal reflux disease)    Yeast infection     Past Surgical History:  Procedure Laterality Date   BREAST BIOPSY Left 07/28/2022   [place] LT BREAST BX W LOC DEV 1ST LESION IMG BX SPEC US GUIDE 07/28/2022 [facility]    Social History   Socioeconomic History   Marital status: Married    Spouse name: Not on file   Number of  children: 3   Years of education: Not on file   Highest education level: Master's degree (e.g., MA, MS, MEng, MEd, MSW, MBA)  Occupational History   Not on file  Tobacco Use   Smoking status: Never   Smokeless tobacco: Never  Substance and Sexual Activity   Alcohol use: Yes    Alcohol/week: 1.0 standard drink of alcohol    Types: 1 Glasses of wine per week    Comment: occ   Drug use: No   Sexual activity: Yes    Birth control/protection: Other-see comments, None    Comment: husband vasectomy.  Other Topics Concern   Not on file  Social History Narrative   Not on file   Social Drivers of Health   Financial Resource Strain: Low Risk  (04/19/2023)   Overall Financial Resource Strain (CARDIA)    Difficulty of Paying Living Expenses: Not hard at all  Food Insecurity: No Food Insecurity (06/28/2023)   Hunger Vital Sign    Worried About Running Out of Food in the Last Year: Never true    Ran Out of Food in the Last Year: Never true  Transportation Needs: No Transportation Needs (06/28/2023)   PRAPARE - Administrator, Civil Service (Medical): No    Lack of Transportation (Non-Medical): No  Physical Activity: Insufficiently Active (04/19/2023)   Exercise Vital Sign  Days of Exercise per Week: 3 days    Minutes of Exercise per Session: 30 min  Stress: No Stress Concern Present (04/19/2023)   [employer] of Occupational Health - Occupational Stress Questionnaire    Feeling of Stress : Only a little  Social Connections: Socially Integrated (04/19/2023)   Social Connection and Isolation Panel [NHANES]    Frequency of Communication with Friends and Family: More than three times a week    Frequency of Social Gatherings with Friends and Family: More than three times a week    Attends Religious Services: 1 to 4 times per year    Active Member of [employer] or Organizations: Yes    Attends Engineer, structural: More than 4 times per year    Marital Status: Married      FAMILY HISTORY:  We obtained a detailed, 4-generation family history.  Significant diagnoses are listed below: Family History  Problem Relation Age of Onset   Hyperlipidemia Mother    Heart disease Mother    Hypertension Mother    Alcohol abuse Mother    Diabetes Father    Hypertension Father    Alcohol abuse Father    Uterine cancer Sister 102   Pancreatic cancer Maternal Aunt 55   Colon cancer Paternal Aunt 38     The patient had three sons who are cancer free.  Her sister had uterine cancer at 69.  The patient's parents are living.  The patient's mother is living at 53.   She has a sister who had pancreatic cancer at 63.  The maternal grandparents are deceased.  The grandmother died in childbirth, and the grandfather died of heart disease.  The patient's father is living.  He had two sisters, one who had colon cancer at 63.  There is no other reported family history of cancer.    Taylor Vance is aware of previous family history of genetic testing for hereditary cancer risks. There is no reported Ashkenazi Jewish ancestry. There is no known consanguinity.  GENETIC COUNSELING ASSESSMENT: Taylor Vance is a 48 y.o. female with a family history of cancer which is somewhat suggestive of a hereditary cancer syndrome and predisposition to cancer given the combination of cancer, and young ages of onset in all individuals. We, therefore, discussed and recommended the following at today's visit.   DISCUSSION: We discussed that, in general, most cancer is not inherited in families, but instead is sporadic or familial. Sporadic cancers occur by chance and typically happen at older ages (>50 years) as this type of cancer is caused by genetic changes acquired during an individual's lifetime. Some families have more cancers than would be expected by chance; however, the ages or types of cancer are not consistent with a known genetic mutation or known genetic mutations have been ruled out. This type  of familial cancer is thought to be due to a combination of multiple genetic, environmental, hormonal, and lifestyle factors. While this combination of factors likely increases the risk of cancer, the exact source of this risk is not currently identifiable or testable.  We discussed that 5 - 10% of cancer is hereditary, with most cases of uterine cancer associated with Lynch syndrome.  There are other genes that can be associated with hereditary uterine cancer syndromes.  These include PTEN and a few others.  We discussed that testing is beneficial for several reasons including knowing how to follow individuals after completing their treatment, identifying whether potential treatment options such as PARP inhibitors would be  beneficial, and understand if other family members could be at risk for cancer and allow them to undergo genetic testing.   We reviewed the characteristics, features and inheritance patterns of hereditary cancer syndromes. We also discussed genetic testing, including the appropriate family members to test, the process of testing, insurance coverage and turn-around-time for results. We discussed the implications of a negative, positive, carrier and/or variant of uncertain significant result. Taylor Vance  was offered a common hereditary cancer panel (36+ genes) and an expanded pan-cancer panel (70+ genes). Taylor Vance was informed of the benefits and limitations of each panel, including that expanded pan-cancer panels contain genes that do not have clear management guidelines at this point in time.  We also discussed that as the number of genes included on a panel increases, the chances of variants of uncertain significance increases. Taylor Vance decided to pursue genetic testing for the CancerNext-Expanded+RNAinsight gene panel.   The CancerNext-Expanded gene panel offered by [facility] and includes sequencing, rearrangement, and RNA analysis for the following 76 genes: AIP, ALK, APC, ATM,  AXIN2, BAP1, BARD1, BMPR1A, BRCA1, BRCA2, BRIP1, CDC73, CDH1, CDK4, CDKN1B, CDKN2A, CEBPA, CHEK2, CTNNA1, DDX41, DICER1, ETV6, FH, FLCN, GATA2, LZTR1, MAX, MBD4, MEN1, MET, MLH1, MSH2, MSH3, MSH6, MUTYH, NF1, NF2, NTHL1, PALB2, PHOX2B, PMS2, POT1, PRKAR1A, PTCH1, PTEN, RAD51C, RAD51D, RB1, RET, RUNX1, SDHA, SDHAF2, SDHB, SDHC, SDHD, SMAD4, SMARCA4, SMARCB1, SMARCE1, STK11, SUFU, TMEM127, TP53, TSC1, TSC2, VHL, and WT1 (sequencing and deletion/duplication); EGFR, HOXB13, KIT, MITF, PDGFRA, POLD1, and POLE (sequencing only); EPCAM and GREM1 (deletion/duplication only).    Based on Taylor Vance's family history of cancer, she meets medical criteria for genetic testing. Though Taylor Vance is not personally affected, most affected family members that are deceased and unable to undergo hereditary cancer testing.  Therefore, Taylor Vance the most informative family member available.  Despite that she meets criteria, she may still have an out of pocket cost. We discussed that if her out of pocket cost for testing is over $100, the laboratory will call and confirm whether she wants to proceed with testing.  If the out of pocket cost of testing is less than $100 she will be billed by the genetic testing laboratory.   We discussed that some people do not want to undergo genetic testing due to fear of genetic discrimination.  The Genetic Information Nondiscrimination Act (GINA) was signed into federal law in 2008. GINA prohibits health insurers and most employers from discriminating against individuals based on genetic information (including the results of genetic tests and family history information). According to GINA, health insurance companies cannot consider genetic information to be a preexisting condition, nor can they use it to make decisions regarding coverage or rates. GINA also makes it illegal for most employers to use genetic information in making decisions about hiring, firing, promotion, or terms of employment.  It is important to note that GINA does not offer protections for life insurance, disability insurance, or long-term care insurance. GINA does not apply to those in the [employer], those who work for companies with less than 15 employees, and new life insurance or long-term disability insurance policies.  Health status due to a cancer diagnosis is not protected under GINA. More information about GINA can be found by visiting [email or web address].  PLAN: After considering the risks, benefits, and limitations, Taylor Vance provided informed consent to pursue genetic testing and the blood sample was sent to [facility] for analysis of the CancerNext-Expanded+RNAinsight. Results should be available within approximately 2-3 weeks'  time, at which point they will be disclosed by telephone to Taylor Vance, as will any additional recommendations warranted by these results. Taylor Vance will receive a summary of her genetic counseling visit and a copy of her results once available. This information will also be available in Epic.   Lastly, we encouraged Taylor Vance to remain in contact with cancer genetics annually so that we can continuously update the family history and inform her of any changes in cancer genetics and testing that may be of benefit for this family.   Taylor Vance questions were answered to her satisfaction today. Our contact information was provided should additional questions or concerns arise. Thank you for the referral and allowing [place] to share in the care of your patient.   Adryanna Friedt P. Lowell Guitar, MS, CGC Licensed, Patent attorney [email or web address] phone: [phone]  60 minutes were spent on the date of the encounter in service to the patient including preparation, face-to-face consultation, documentation and care coordination.  The patient was seen alone.  Drs. Meliton Rattan, and/or [place] were available for questions, if needed..     _______________________________________________________________________ For Office Staff:  Number of people involved in session: 1 Was an Intern/ student involved with case: no

## 2023-07-28 ENCOUNTER — Ambulatory Visit

## 2023-07-28 VITALS — BP 114/75 | HR 65 | Temp 98.3°F | Resp 18 | Ht 66.0 in | Wt 212.0 lb

## 2023-07-28 DIAGNOSIS — D649 Anemia, unspecified: Secondary | ICD-10-CM | POA: Diagnosis not present

## 2023-07-28 MED ORDER — SODIUM CHLORIDE 0.9 % IV BOLUS
250.0000 mL | Freq: Once | INTRAVENOUS | Status: DC
  Filled 2023-07-28: qty 250

## 2023-07-28 MED ORDER — IRON SUCROSE 20 MG/ML IV SOLN
200.0000 mg | Freq: Once | INTRAVENOUS | Status: AC
  Administered 2023-07-28: 200 mg via INTRAVENOUS
  Filled 2023-07-28: qty 10

## 2023-07-28 MED ORDER — DIPHENHYDRAMINE HCL 25 MG PO CAPS: 25.0000 mg | ORAL_CAPSULE | Freq: Once | ORAL | Status: DC

## 2023-07-28 MED ORDER — ACETAMINOPHEN 325 MG PO TABS
650.0000 mg | ORAL_TABLET | Freq: Once | ORAL | Status: DC
Start: 2023-07-28 — End: 2023-07-28

## 2023-07-28 NOTE — Progress Notes (Signed)
 Diagnosis: Acute Anemia  Provider:  Chilton Greathouse MD  Procedure: IV Push  IV Type: Peripheral, IV Location: L Antecubital  Venofer (Iron Sucrose), Dose: 200 mg  Post Infusion IV Care: Observation period completed and Peripheral IV Discontinued  Discharge: Condition: Good, Destination: Home . AVS Declined  Performed by:  Nat Math, RN

## 2023-08-01 ENCOUNTER — Other Ambulatory Visit: Payer: Self-pay | Admitting: Nurse Practitioner

## 2023-08-04 ENCOUNTER — Ambulatory Visit

## 2023-08-04 VITALS — BP 123/76 | HR 94 | Temp 98.4°F | Resp 16 | Ht 66.0 in | Wt 211.8 lb

## 2023-08-04 DIAGNOSIS — D649 Anemia, unspecified: Secondary | ICD-10-CM | POA: Diagnosis not present

## 2023-08-04 MED ORDER — EPINEPHRINE 0.3 MG/0.3ML IJ SOAJ: 0.3000 mg | Freq: Once | INTRAMUSCULAR | Status: DC | PRN

## 2023-08-04 MED ORDER — FAMOTIDINE IN NACL 20-0.9 MG/50ML-% IV SOLN
20.0000 mg | Freq: Once | INTRAVENOUS | Status: DC | PRN
Start: 2023-08-04 — End: 2023-08-04

## 2023-08-04 MED ORDER — ACETAMINOPHEN 325 MG PO TABS: 650.0000 mg | ORAL_TABLET | Freq: Once | ORAL | Status: DC

## 2023-08-04 MED ORDER — SODIUM CHLORIDE 0.9 % IV BOLUS
100.0000 mL | Freq: Once | INTRAVENOUS | Status: AC
  Administered 2023-08-04: 100 mL via INTRAVENOUS
  Filled 2023-08-04: qty 100

## 2023-08-04 MED ORDER — DIPHENHYDRAMINE HCL 25 MG PO CAPS: 25.0000 mg | ORAL_CAPSULE | Freq: Once | ORAL | Status: DC

## 2023-08-04 MED ORDER — ALBUTEROL SULFATE HFA 108 (90 BASE) MCG/ACT IN AERS
2.0000 | INHALATION_SPRAY | Freq: Once | RESPIRATORY_TRACT | Status: DC | PRN
Start: 2023-08-04 — End: 2023-08-04

## 2023-08-04 MED ORDER — METHYLPREDNISOLONE SODIUM SUCC 125 MG IJ SOLR: 125.0000 mg | Freq: Once | INTRAMUSCULAR | Status: DC | PRN

## 2023-08-04 MED ORDER — DIPHENHYDRAMINE HCL 50 MG/ML IJ SOLN: 50.0000 mg | Freq: Once | INTRAMUSCULAR | Status: DC | PRN

## 2023-08-04 MED ORDER — SODIUM CHLORIDE 0.9 % IV SOLN
Freq: Once | INTRAVENOUS | Status: DC | PRN
Start: 2023-08-04 — End: 2023-08-04

## 2023-08-04 MED ORDER — IRON SUCROSE 20 MG/ML IV SOLN
200.0000 mg | Freq: Once | INTRAVENOUS | Status: AC
  Administered 2023-08-04: 200 mg via INTRAVENOUS
  Filled 2023-08-04: qty 10

## 2023-08-04 NOTE — Progress Notes (Signed)
 Diagnosis: Iron Deficiency Anemia  Provider:  Chilton Greathouse MD  Procedure: IV Push  IV Type: Peripheral, IV Location: R Antecubital  Venofer (Iron Sucrose), Dose: 200 mg  Post Infusion IV Care: Observation period completed and Peripheral IV Discontinued  Discharge: Condition: Good, Destination: Home . AVS Declined  Performed by:  Adriana Mccallum, RN

## 2023-08-11 ENCOUNTER — Ambulatory Visit (INDEPENDENT_AMBULATORY_CARE_PROVIDER_SITE_OTHER)

## 2023-08-11 ENCOUNTER — Ambulatory Visit: Payer: Self-pay | Admitting: Genetic Counselor

## 2023-08-11 ENCOUNTER — Encounter: Payer: Self-pay | Admitting: Genetic Counselor

## 2023-08-11 ENCOUNTER — Telehealth: Payer: Self-pay | Admitting: Genetic Counselor

## 2023-08-11 VITALS — BP 104/72 | HR 69 | Temp 98.3°F | Resp 16 | Ht 66.0 in | Wt 211.2 lb

## 2023-08-11 DIAGNOSIS — D649 Anemia, unspecified: Secondary | ICD-10-CM

## 2023-08-11 DIAGNOSIS — Z1379 Encounter for other screening for genetic and chromosomal anomalies: Secondary | ICD-10-CM | POA: Insufficient documentation

## 2023-08-11 MED ORDER — DIPHENHYDRAMINE HCL 25 MG PO CAPS: 25.0000 mg | ORAL_CAPSULE | Freq: Once | ORAL | Status: DC

## 2023-08-11 MED ORDER — ACETAMINOPHEN 325 MG PO TABS: 650.0000 mg | ORAL_TABLET | Freq: Once | ORAL | Status: DC

## 2023-08-11 MED ORDER — SODIUM CHLORIDE 0.9 % IV BOLUS
100.0000 mL | Freq: Once | INTRAVENOUS | Status: AC
Start: 2023-08-11 — End: 2023-08-11
  Administered 2023-08-11: 100 mL via INTRAVENOUS
  Filled 2023-08-11: qty 100

## 2023-08-11 MED ORDER — IRON SUCROSE 20 MG/ML IV SOLN
200.0000 mg | Freq: Once | INTRAVENOUS | Status: AC
  Administered 2023-08-11: 200 mg via INTRAVENOUS
  Filled 2023-08-11: qty 10

## 2023-08-11 NOTE — Addendum Note (Signed)
 Addended by: Nat Math on: 08/11/2023 11:45 AM   Modules accepted: Orders

## 2023-08-11 NOTE — Progress Notes (Signed)
 Diagnosis: Acute Anemia  Provider:  Chilton Greathouse MD  Procedure: IV Push  IV Type: Peripheral, IV Location: R Antecubital  Venofer (Iron Sucrose), Dose: 200 mg  Post Infusion IV Care: Observation period completed and Peripheral IV Discontinued  Discharge: Condition: Good, Destination: Home . AVS Declined  Performed by:  Nat Math, RN

## 2023-08-11 NOTE — Telephone Encounter (Signed)
Revealed negative genetic testing.  Discussed that we do not know why there is cancer in the family. It could be due to a different gene that we are not testing, or maybe our current technology may not be able to pick something up.  It will be important for her to keep in contact with genetics to keep up with whether additional testing may be needed.  

## 2023-08-11 NOTE — Progress Notes (Signed)
 HPI:  Taylor Vance was previously seen in the  Cancer Genetics clinic due to a family history of cancer and concerns regarding a hereditary predisposition to cancer. Please refer to our prior cancer genetics clinic note for more information regarding our discussion, assessment and recommendations, at the time. Taylor Vance recent genetic test results were disclosed to her, as were recommendations warranted by these results. These results and recommendations are discussed in more detail below.  CANCER HISTORY:  Oncology History   No history exists.    FAMILY HISTORY:  We obtained a detailed, 4-generation family history.  Significant diagnoses are listed below: Family History  Problem Relation Age of Onset   Hyperlipidemia Mother    Heart disease Mother    Hypertension Mother    Alcohol abuse Mother    Diabetes Father    Hypertension Father    Alcohol abuse Father    Uterine cancer Sister 55   Pancreatic cancer Maternal Aunt 49   Colon cancer Paternal Aunt 58       The patient had three sons who are cancer free.  Her sister had uterine cancer at 38.  The patient's parents are living.   The patient's mother is living at 42.   She has a sister who had pancreatic cancer at 4.  The maternal grandparents are deceased.  The grandmother died in childbirth, and the grandfather died of heart disease.   The patient's father is living.  He had two sisters, one who had colon cancer at 57.  There is no other reported family history of cancer.    Taylor Vance is aware of previous family history of genetic testing for hereditary cancer risks. There is no reported Ashkenazi Jewish ancestry. There is no known consanguinity  GENETIC TEST RESULTS: Genetic testing reported out on August 11, 2023 through the CancerNext-Expanded+RNAinsight cancer panel found no pathogenic mutations. The CancerNext-Expanded gene panel offered by [facility] and includes sequencing, rearrangement, and RNA analysis  for the following 76 genes: AIP, ALK, APC, ATM, AXIN2, BAP1, BARD1, BMPR1A, BRCA1, BRCA2, BRIP1, CDC73, CDH1, CDK4, CDKN1B, CDKN2A, CEBPA, CHEK2, CTNNA1, DDX41, DICER1, ETV6, FH, FLCN, GATA2, LZTR1, MAX, MBD4, MEN1, MET, MLH1, MSH2, MSH3, MSH6, MUTYH, NF1, NF2, NTHL1, PALB2, PHOX2B, PMS2, POT1, PRKAR1A, PTCH1, PTEN, RAD51C, RAD51D, RB1, RET, RUNX1, SDHA, SDHAF2, SDHB, SDHC, SDHD, SMAD4, SMARCA4, SMARCB1, SMARCE1, STK11, SUFU, TMEM127, TP53, TSC1, TSC2, VHL, and WT1 (sequencing and deletion/duplication); EGFR, HOXB13, KIT, MITF, PDGFRA, POLD1, and POLE (sequencing only); EPCAM and GREM1 (deletion/duplication only).. The test report has been scanned into EPIC and is located under the Molecular Pathology section of the Results Review tab.  A portion of the result report is included below for reference.     We discussed with Taylor Vance that because current genetic testing is not perfect, it is possible there may be a gene mutation in one of these genes that current testing cannot detect, but that chance is small.  We also discussed, that there could be another gene that has not yet been discovered, or that we have not yet tested, that is responsible for the cancer diagnoses in the family. It is also possible there is a hereditary cause for the cancer in the family that Taylor Vance did not inherit and therefore was not identified in her testing.  Therefore, it is important to remain in touch with cancer genetics in the future so that we can continue to offer Taylor Vance the most up to date genetic testing.   ADDITIONAL GENETIC  TESTING: We discussed with Taylor Vance that her genetic testing was fairly extensive.  If there are genes identified to increase cancer risk that can be analyzed in the future, we would be happy to discuss and coordinate this testing at that time.    CANCER SCREENING RECOMMENDATIONS: Taylor Vance test result is considered negative (normal).  This means that we have not identified a hereditary  cause for her personal and family history of cancer at this time. Most cancers happen by chance and this negative test suggests that her personal and family history of cancer may fall into this category.    Possible reasons for Taylor Vance's negative genetic test include:  1. There may be a gene mutation in one of these genes that current testing methods cannot detect but that chance is small.  2. There could be another gene that has not yet been discovered, or that we have not yet tested, that is responsible for the cancer diagnoses in the family.  3.  There may be no hereditary risk for cancer in the family. The cancers in Taylor Vance and/or her family may be sporadic/familial or due to other genetic and environmental factors. 4. It is also possible there is a hereditary cause for the cancer in the family that Taylor Vance did not inherit.  Therefore, it is recommended she continue to follow the cancer management and screening guidelines provided by her oncology and primary healthcare provider. An individual's cancer risk and medical management are not determined by genetic test results alone. Overall cancer risk assessment incorporates additional factors, including personal medical history, family history, and any available genetic information that may result in a personalized plan for cancer prevention and surveillance  RECOMMENDATIONS FOR FAMILY MEMBERS:   Since she did not inherit a identifiable mutation in a cancer predisposition gene included on this panel, her children could not have inherited a known mutation from her in one of these genes. Individuals in this family might be at some increased risk of developing cancer, over the general population risk, simply due to the family history of cancer.  We recommended women in this family have a yearly mammogram beginning at age 71, or 32 years younger than the earliest onset of cancer, an annual clinical breast exam, and perform monthly breast  self-exams. Women in this family should also have a gynecological exam as recommended by their primary provider. All family members should be referred for colonoscopy starting at age 80, or 20 years younger than the earliest onset of cancer.  FOLLOW-UP: Lastly, we discussed with Taylor Vance that cancer genetics is a rapidly advancing field and it is possible that new genetic tests will be appropriate for her and/or her family members in the future. We encouraged her to remain in contact with cancer genetics on an annual basis so we can update her personal and family histories and let her know of advances in cancer genetics that may benefit this family.   Our contact number was provided. Taylor Vance questions were answered to her satisfaction, and she knows she is welcome to call us at anytime with additional questions or concerns.   Maylon Cos, MS, [facility] Licensed, Certified Genetic Counselor [email or web address]

## 2023-08-30 ENCOUNTER — Other Ambulatory Visit: Payer: Self-pay | Admitting: Obstetrics and Gynecology

## 2023-09-05 LAB — SURGICAL PATHOLOGY

## 2023-10-04 ENCOUNTER — Other Ambulatory Visit: Payer: Self-pay

## 2023-10-04 DIAGNOSIS — D508 Other iron deficiency anemias: Secondary | ICD-10-CM

## 2023-10-04 DIAGNOSIS — D649 Anemia, unspecified: Secondary | ICD-10-CM

## 2023-10-05 ENCOUNTER — Inpatient Hospital Stay: Payer: Self-pay | Attending: Nurse Practitioner

## 2023-10-05 ENCOUNTER — Inpatient Hospital Stay (HOSPITAL_BASED_OUTPATIENT_CLINIC_OR_DEPARTMENT_OTHER): Payer: Self-pay | Admitting: Hematology

## 2023-10-05 VITALS — BP 116/64 | HR 78 | Temp 97.7°F | Resp 15 | Ht 66.0 in | Wt 215.3 lb

## 2023-10-05 DIAGNOSIS — D649 Anemia, unspecified: Secondary | ICD-10-CM

## 2023-10-05 DIAGNOSIS — Z79899 Other long term (current) drug therapy: Secondary | ICD-10-CM | POA: Diagnosis not present

## 2023-10-05 DIAGNOSIS — D508 Other iron deficiency anemias: Secondary | ICD-10-CM

## 2023-10-05 DIAGNOSIS — N920 Excessive and frequent menstruation with regular cycle: Secondary | ICD-10-CM | POA: Diagnosis present

## 2023-10-05 DIAGNOSIS — D509 Iron deficiency anemia, unspecified: Secondary | ICD-10-CM | POA: Diagnosis present

## 2023-10-05 DIAGNOSIS — D259 Leiomyoma of uterus, unspecified: Secondary | ICD-10-CM | POA: Insufficient documentation

## 2023-10-05 LAB — CBC WITH DIFFERENTIAL (CANCER CENTER ONLY)
Abs Immature Granulocytes: 0.03 10*3/uL (ref 0.00–0.07)
Basophils Absolute: 0.1 10*3/uL (ref 0.0–0.1)
Basophils Relative: 1 %
Eosinophils Absolute: 0.1 10*3/uL (ref 0.0–0.5)
Eosinophils Relative: 1 %
HCT: 38.5 % (ref 36.0–46.0)
Hemoglobin: 12 g/dL (ref 12.0–15.0)
Immature Granulocytes: 0 %
Lymphocytes Relative: 27 %
Lymphs Abs: 2.1 10*3/uL (ref 0.7–4.0)
MCH: 23.3 pg — ABNORMAL LOW (ref 26.0–34.0)
MCHC: 31.2 g/dL (ref 30.0–36.0)
MCV: 74.8 fL — ABNORMAL LOW (ref 80.0–100.0)
Monocytes Absolute: 0.5 10*3/uL (ref 0.1–1.0)
Monocytes Relative: 7 %
Neutro Abs: 5.1 10*3/uL (ref 1.7–7.7)
Neutrophils Relative %: 64 %
Platelet Count: 371 10*3/uL (ref 150–400)
RBC: 5.15 MIL/uL — ABNORMAL HIGH (ref 3.87–5.11)
RDW: 15.6 % — ABNORMAL HIGH (ref 11.5–15.5)
WBC Count: 7.9 10*3/uL (ref 4.0–10.5)
nRBC: 0 % (ref 0.0–0.2)

## 2023-10-05 LAB — IRON AND IRON BINDING CAPACITY (CC-WL,HP ONLY)
Iron: 58 ug/dL (ref 28–170)
Saturation Ratios: 16 % (ref 10.4–31.8)
TIBC: 375 ug/dL (ref 250–450)
UIBC: 317 ug/dL (ref 148–442)

## 2023-10-05 LAB — CMP (CANCER CENTER ONLY)
ALT: 14 U/L (ref 0–44)
AST: 15 U/L (ref 15–41)
Albumin: 4.1 g/dL (ref 3.5–5.0)
Alkaline Phosphatase: 89 U/L (ref 38–126)
Anion gap: 5 (ref 5–15)
BUN: 9 mg/dL (ref 6–20)
CO2: 29 mmol/L (ref 22–32)
Calcium: 9.2 mg/dL (ref 8.9–10.3)
Chloride: 104 mmol/L (ref 98–111)
Creatinine: 0.65 mg/dL (ref 0.44–1.00)
GFR, Estimated: >60 mL/min (ref >60)
Glucose, Bld: 89 mg/dL (ref 70–99)
Potassium: 4.2 mmol/L (ref 3.5–5.1)
Sodium: 138 mmol/L (ref 135–145)
Total Bilirubin: 0.2 mg/dL (ref 0.0–1.2)
Total Protein: 7.6 g/dL (ref 6.5–8.1)

## 2023-10-05 LAB — VITAMIN B12: Vitamin B-12: 204 pg/mL (ref 180–914)

## 2023-10-05 LAB — FERRITIN: Ferritin: 95 ng/mL (ref 11–307)

## 2023-10-05 NOTE — Progress Notes (Signed)
 [facility]   [phone] [phone]   Clinic Follow up Note   Patient Care Team: Susanna Epley, FNP as PCP - General (General Practice)  Date of Service:  10/05/2023  CHIEF COMPLAINT: f/u of iron  deficient anemia  CURRENT THERAPY:  IV iron  as needed if ferritin<15  Assessment & Plan Iron  deficiency anemia Iron  deficiency anemia previously treated with IV iron  infusions. Hemoglobin levels have improved to 12, indicating a return to normal levels. She reports no significant change in symptoms post-infusion. B12 levels are on the low end of normal, likely due to dietary restrictions as she does not consume meat, fish, chicken, or eggs. - Recommend oral B12 supplementation due to low end of normal B12 levels. - Advise continuation of oral iron  supplementation to maintain iron  levels, especially due to ongoing blood loss from menstruation. - Encourage dietary changes to include more iron -rich foods, though she does not consume meat, fish, chicken, or eggs. - Schedule lab tests every four months to monitor hemoglobin, iron , B12, and ferritin levels.  Heavy menstrual bleeding Heavy menstrual bleeding with frequent changes of period panties and pads during the first two days of menstruation, contributing to iron  deficiency anemia.  Uterine fibroids Recently diagnosed uterine fibroids, confirmed two weeks ago.   Plan - She tolerated and responded well to IV iron , anemia resolved. - Continue lab monitoring every 4 months, will schedule IV iron  if ferritin less than 50 - I encouraged her to restart oral iron , she was previously on liquid iron . - Follow-up in 1 year.   Discussed the use of AI scribe software for clinical note transcription with the patient, who gave verbal consent to proceed.  History of Present Illness Taylor Vance is a 48 year old female with iron  deficiency anemia who presents for follow-up after IV iron  treatment.  She  received five doses of IV iron  from March to April with no adverse effects. Her hemoglobin improved from 9.4 g/dL to 12 g/dL. She discontinued liquid oral iron  during the IV treatment. She feels 'pretty much the same' post-treatment.  She experiences heavy menstrual periods, changing period panties twice daily and pads every one to two hours during the first two days of her cycle. She has no other bleeding issues. She has been diagnosed with uterine fibroids and is awaiting further management discussion.  Her B12 levels are slightly low but within normal range. She follows a vegetarian diet, avoiding meat, fish, chicken, and eggs, which may affect her iron  intake.     All other systems were reviewed with the patient and are negative.  MEDICAL HISTORY:  Past Medical History:  Diagnosis Date   Anemia    Family history of colon cancer    Family history of pancreatic cancer    Family history of uterine cancer    GERD (gastroesophageal reflux disease)    Yeast infection     SURGICAL HISTORY: Past Surgical History:  Procedure Laterality Date   BREAST BIOPSY Left 07/28/2022   US  LT BREAST BX W LOC DEV 1ST LESION IMG BX SPEC US  GUIDE 07/28/2022 [facility]    I have reviewed the social history and family history with the patient and they are unchanged from previous note.  ALLERGIES:  has no known allergies.  MEDICATIONS:  Current Outpatient Medications  Medication Sig Dispense Refill   Multiple Vitamin (MULTIVITAMIN) tablet Take 1 tablet by mouth daily.     ferrous sulfate  325 (65 FE) MG EC tablet TAKE 1 TABLET BY  MOUTH TWICE A DAY (Patient not taking: Reported on 10/05/2023) 180 tablet 1   No current facility-administered medications for this visit.    PHYSICAL EXAMINATION: ECOG PERFORMANCE STATUS: 0 - Asymptomatic  Vitals:   10/05/23 1105  BP: 116/64  Pulse: 78  Resp: 15  Temp: 97.7 F (36.5 C)  SpO2: 99%   Wt Readings from Last 3 Encounters:  10/05/23 215 lb 4.8 oz  (97.7 kg)  08/11/23 211 lb 3.2 oz (95.8 kg)  08/04/23 211 lb 12.8 oz (96.1 kg)     GENERAL:alert, no distress and comfortable SKIN: skin color, texture, turgor are normal, no rashes or significant lesions EYES: normal, Conjunctiva are pink and non-injected, sclera clear Musculoskeletal:no cyanosis of digits and no clubbing  NEURO: alert & oriented x 3 with fluent speech, no focal motor/sensory deficits  Physical Exam    LABORATORY DATA:  I have reviewed the data as listed    Latest Ref Rng & Units 10/05/2023   10:07 AM 06/28/2023    3:35 PM 04/20/2023    3:40 PM  CBC  WBC 4.0 - 10.5 K/uL 7.9  8.2  8.5   Hemoglobin 12.0 - 15.0 g/dL 16.1  09.6  04.5   Hematocrit 36.0 - 46.0 % 38.5  36.9  33.9   Platelets 150 - 400 K/uL 371  453  445         Latest Ref Rng & Units 10/05/2023   10:07 AM 06/28/2023    3:35 PM 04/20/2023    3:40 PM  CMP  Glucose 70 - 99 mg/dL 89  83  80   BUN 6 - 20 mg/dL 9  8  6    Creatinine 0.44 - 1.00 mg/dL 4.09  8.11  9.14   Sodium 135 - 145 mmol/L 138  138  138   Potassium 3.5 - 5.1 mmol/L 4.2  3.6  4.1   Chloride 98 - 111 mmol/L 104  102  103   CO2 22 - 32 mmol/L 29  29  21    Calcium 8.9 - 10.3 mg/dL 9.2  9.5  9.4   Total Protein 6.5 - 8.1 g/dL 7.6  8.0  7.1   Total Bilirubin 0.0 - 1.2 mg/dL 0.2  0.3  0.2   Alkaline Phos 38 - 126 U/L 89  114  115   AST 15 - 41 U/L 15  21  21    ALT 0 - 44 U/L 14  17  17        RADIOGRAPHIC STUDIES: I have personally reviewed the radiological images as listed and agreed with the findings in the report. No results found.    No orders of the defined types were placed in this encounter.  All questions were answered. The patient knows to call the clinic with any problems, questions or concerns. No barriers to learning was detected. The total time spent in the appointment was 20 minutes, including review of chart and various tests results, discussions about plan of care and coordination of care plan     Sonja McNary,  MD 10/05/2023

## 2023-10-06 ENCOUNTER — Ambulatory Visit: Payer: Self-pay | Admitting: Nurse Practitioner

## 2024-02-01 ENCOUNTER — Inpatient Hospital Stay: Attending: Nurse Practitioner

## 2024-02-01 DIAGNOSIS — Z79899 Other long term (current) drug therapy: Secondary | ICD-10-CM | POA: Diagnosis not present

## 2024-02-01 DIAGNOSIS — D509 Iron deficiency anemia, unspecified: Secondary | ICD-10-CM | POA: Diagnosis present

## 2024-02-01 DIAGNOSIS — D508 Other iron deficiency anemias: Secondary | ICD-10-CM

## 2024-02-01 DIAGNOSIS — D259 Leiomyoma of uterus, unspecified: Secondary | ICD-10-CM | POA: Insufficient documentation

## 2024-02-01 DIAGNOSIS — N920 Excessive and frequent menstruation with regular cycle: Secondary | ICD-10-CM | POA: Diagnosis present

## 2024-02-01 DIAGNOSIS — D649 Anemia, unspecified: Secondary | ICD-10-CM

## 2024-02-01 LAB — IRON AND IRON BINDING CAPACITY (CC-WL,HP ONLY)
Iron: 41 ug/dL (ref 28–170)
Saturation Ratios: 9 % — ABNORMAL LOW (ref 10.4–31.8)
TIBC: 449 ug/dL (ref 250–450)
UIBC: 408 ug/dL (ref 148–442)

## 2024-02-01 LAB — CBC WITH DIFFERENTIAL (CANCER CENTER ONLY)
Abs Immature Granulocytes: 0.01 K/uL (ref 0.00–0.07)
Basophils Absolute: 0.1 K/uL (ref 0.0–0.1)
Basophils Relative: 1 %
Eosinophils Absolute: 0.2 K/uL (ref 0.0–0.5)
Eosinophils Relative: 3 %
HCT: 35.5 % — ABNORMAL LOW (ref 36.0–46.0)
Hemoglobin: 11.1 g/dL — ABNORMAL LOW (ref 12.0–15.0)
Immature Granulocytes: 0 %
Lymphocytes Relative: 31 %
Lymphs Abs: 2.1 K/uL (ref 0.7–4.0)
MCH: 23.1 pg — ABNORMAL LOW (ref 26.0–34.0)
MCHC: 31.3 g/dL (ref 30.0–36.0)
MCV: 74 fL — ABNORMAL LOW (ref 80.0–100.0)
Monocytes Absolute: 0.4 K/uL (ref 0.1–1.0)
Monocytes Relative: 7 %
Neutro Abs: 3.9 K/uL (ref 1.7–7.7)
Neutrophils Relative %: 58 %
Platelet Count: 475 K/uL — ABNORMAL HIGH (ref 150–400)
RBC: 4.8 MIL/uL (ref 3.87–5.11)
RDW: 14.6 % (ref 11.5–15.5)
WBC Count: 6.7 K/uL (ref 4.0–10.5)
nRBC: 0 % (ref 0.0–0.2)

## 2024-02-01 LAB — FERRITIN: Ferritin: 17 ng/mL (ref 11–307)

## 2024-02-01 LAB — VITAMIN B12: Vitamin B-12: 439 pg/mL (ref 180–914)

## 2024-02-05 ENCOUNTER — Ambulatory Visit: Payer: Self-pay | Admitting: Hematology

## 2024-02-05 ENCOUNTER — Other Ambulatory Visit: Payer: Self-pay | Admitting: Hematology

## 2024-02-06 ENCOUNTER — Telehealth: Payer: Self-pay

## 2024-02-06 NOTE — Telephone Encounter (Signed)
 Dr. Lanny, patient will be scheduled as soon as possible.  Auth Submission: NO AUTH NEEDED Site of care: Site of care: [facility] Payer: [employer] state health plan Medication & CPT/J Code(s) submitted: Venofer  (Iron  Sucrose) J1756 Diagnosis Code:  Route of submission (phone, fax, portal):  Phone # Fax # Auth type: Buy/Bill PB Units/visits requested: 200mg  x 5 doses Reference number:  Approval from: 02/06/24 to 05/02/24

## 2024-02-08 ENCOUNTER — Encounter: Payer: Self-pay | Admitting: Hematology

## 2024-02-08 NOTE — Telephone Encounter (Addendum)
 Called patient to relay message below as per Dr. Lanny, no answer to call left a detailed message to contact the [facility] infusion center to set up her app. Also left the phone number and address of the Center for her.    ----- Message from Onita Lanny sent at 02/05/2024  4:00 PM EDT ----- Please let pt know her iron  level is low and I recommend venofer  200mg  X5 at [facility], let her call us  back if she does not hear from them in a week, thx  Onita Lanny  ----- Message ----- From: Rebecka, Lab In [place] Sent: 02/01/2024  10:24 AM EDT To: Onita Lanny, MD

## 2024-02-16 ENCOUNTER — Ambulatory Visit (INDEPENDENT_AMBULATORY_CARE_PROVIDER_SITE_OTHER)

## 2024-02-16 VITALS — BP 123/82 | HR 71 | Temp 98.0°F | Resp 16 | Ht 66.0 in | Wt 216.4 lb

## 2024-02-16 DIAGNOSIS — D649 Anemia, unspecified: Secondary | ICD-10-CM

## 2024-02-16 MED ORDER — IRON SUCROSE 20 MG/ML IV SOLN
200.0000 mg | Freq: Once | INTRAVENOUS | Status: AC
  Administered 2024-02-16: 200 mg via INTRAVENOUS
  Filled 2024-02-16: qty 10

## 2024-02-16 MED ORDER — ACETAMINOPHEN 325 MG PO TABS
650.0000 mg | ORAL_TABLET | Freq: Once | ORAL | Status: DC
  Filled 2024-02-16: qty 2

## 2024-02-16 MED ORDER — DIPHENHYDRAMINE HCL 25 MG PO CAPS
25.0000 mg | ORAL_CAPSULE | Freq: Once | ORAL | Status: DC
  Filled 2024-02-16: qty 1

## 2024-02-16 MED ORDER — SODIUM CHLORIDE 0.9 % IV BOLUS
250.0000 mL | Freq: Once | INTRAVENOUS | Status: AC
  Administered 2024-02-16: 250 mL via INTRAVENOUS
  Filled 2024-02-16: qty 250

## 2024-02-16 NOTE — Progress Notes (Signed)
 Diagnosis: Iron  Deficiency Anemia  Provider:  Praveen Mannam MD  Procedure: IV Push  IV Type: Peripheral, IV Location: R Antecubital  Venofer  (Iron  Sucrose), Dose: 200 mg  Post Infusion IV Care: Observation period completed, Peripheral IV Discontinued, and stayed for 15 minutes of observation.  Discharge: Condition: Good, Destination: Home . AVS Declined  Performed by:  Waddell LOISE Freshwater, RN

## 2024-02-21 ENCOUNTER — Ambulatory Visit

## 2024-02-21 VITALS — BP 119/79 | HR 75 | Temp 97.4°F | Resp 16 | Ht 66.0 in | Wt 217.8 lb

## 2024-02-21 DIAGNOSIS — D649 Anemia, unspecified: Secondary | ICD-10-CM | POA: Diagnosis not present

## 2024-02-21 MED ORDER — DIPHENHYDRAMINE HCL 25 MG PO CAPS: 25.0000 mg | ORAL_CAPSULE | Freq: Once | ORAL | Status: DC

## 2024-02-21 MED ORDER — SODIUM CHLORIDE 0.9 % IV BOLUS
250.0000 mL | Freq: Once | INTRAVENOUS | Status: AC
  Administered 2024-02-21: 250 mL via INTRAVENOUS
  Filled 2024-02-21: qty 250

## 2024-02-21 MED ORDER — IRON SUCROSE 20 MG/ML IV SOLN
200.0000 mg | Freq: Once | INTRAVENOUS | Status: AC
  Administered 2024-02-21: 200 mg via INTRAVENOUS
  Filled 2024-02-21: qty 10

## 2024-02-21 MED ORDER — ACETAMINOPHEN 325 MG PO TABS: 650.0000 mg | ORAL_TABLET | Freq: Once | ORAL | Status: DC

## 2024-02-21 NOTE — Progress Notes (Signed)
 Diagnosis: Acute Anemia  Provider:  Praveen Mannam MD  Procedure: IV Push  IV Type: Peripheral, IV Location: R Antecubital  Venofer  (Iron  Sucrose), Dose: 200 mg  Post Infusion IV Care: Observation period completed and Peripheral IV Discontinued  Discharge: Condition: Good, Destination: Home . AVS Declined  Performed by:  Abram Sax G Pilkington-Burchett, RN

## 2024-02-23 ENCOUNTER — Ambulatory Visit

## 2024-02-23 VITALS — BP 115/77 | HR 73 | Temp 98.9°F | Resp 18 | Ht 66.0 in | Wt 216.8 lb

## 2024-02-23 DIAGNOSIS — D508 Other iron deficiency anemias: Secondary | ICD-10-CM

## 2024-02-23 DIAGNOSIS — D649 Anemia, unspecified: Secondary | ICD-10-CM

## 2024-02-23 MED ORDER — ACETAMINOPHEN 325 MG PO TABS: 650.0000 mg | ORAL_TABLET | Freq: Once | ORAL | Status: DC

## 2024-02-23 MED ORDER — SODIUM CHLORIDE 0.9 % IV BOLUS
250.0000 mL | Freq: Once | INTRAVENOUS | Status: AC
  Administered 2024-02-23: 250 mL via INTRAVENOUS
  Filled 2024-02-23: qty 250

## 2024-02-23 MED ORDER — DIPHENHYDRAMINE HCL 25 MG PO CAPS: 25.0000 mg | ORAL_CAPSULE | Freq: Once | ORAL | Status: DC

## 2024-02-23 MED ORDER — IRON SUCROSE 20 MG/ML IV SOLN
200.0000 mg | Freq: Once | INTRAVENOUS | Status: AC
  Administered 2024-02-23: 200 mg via INTRAVENOUS
  Filled 2024-02-23: qty 10

## 2024-02-23 NOTE — Progress Notes (Signed)
 Diagnosis: Iron Deficiency Anemia  Provider:  Chilton Greathouse MD  Procedure: IV Push  IV Type: Peripheral, IV Location: L Antecubital  Venofer (Iron Sucrose), Dose: 200 mg  Post Infusion IV Care: Observation period completed and Peripheral IV Discontinued  Discharge: Condition: Good, Destination: Home . AVS Declined  Performed by:  Wyvonne Lenz, RN

## 2024-02-28 ENCOUNTER — Ambulatory Visit (INDEPENDENT_AMBULATORY_CARE_PROVIDER_SITE_OTHER)

## 2024-02-28 VITALS — BP 109/75 | HR 67 | Temp 98.1°F | Resp 18 | Ht 66.0 in | Wt 220.8 lb

## 2024-02-28 DIAGNOSIS — D649 Anemia, unspecified: Secondary | ICD-10-CM

## 2024-02-28 DIAGNOSIS — D508 Other iron deficiency anemias: Secondary | ICD-10-CM | POA: Diagnosis not present

## 2024-02-28 MED ORDER — ACETAMINOPHEN 325 MG PO TABS: 650.0000 mg | ORAL_TABLET | Freq: Once | ORAL | Status: DC

## 2024-02-28 MED ORDER — SODIUM CHLORIDE 0.9 % IV BOLUS
250.0000 mL | Freq: Once | INTRAVENOUS | Status: AC
  Administered 2024-02-28: 250 mL via INTRAVENOUS
  Filled 2024-02-28: qty 250

## 2024-02-28 MED ORDER — DIPHENHYDRAMINE HCL 25 MG PO CAPS: 25.0000 mg | ORAL_CAPSULE | Freq: Once | ORAL | Status: DC

## 2024-02-28 MED ORDER — IRON SUCROSE 20 MG/ML IV SOLN
200.0000 mg | Freq: Once | INTRAVENOUS | Status: AC
  Administered 2024-02-28: 200 mg via INTRAVENOUS
  Filled 2024-02-28: qty 10

## 2024-02-28 NOTE — Progress Notes (Signed)
 Diagnosis: Iron Deficiency Anemia  Provider:  Chilton Greathouse MD  Procedure: IV Push  IV Type: Peripheral, IV Location: R Antecubital  Venofer (Iron Sucrose), Dose: 200 mg  Post Infusion IV Care: Observation period completed and Peripheral IV Discontinued  Discharge: Condition: Good, Destination: Home . AVS Declined  Performed by:  Rico Ala, LPN

## 2024-03-01 ENCOUNTER — Ambulatory Visit (INDEPENDENT_AMBULATORY_CARE_PROVIDER_SITE_OTHER)

## 2024-03-01 VITALS — BP 100/68 | HR 71 | Temp 98.4°F | Resp 16 | Ht 66.0 in | Wt 220.4 lb

## 2024-03-01 DIAGNOSIS — D649 Anemia, unspecified: Secondary | ICD-10-CM

## 2024-03-01 MED ORDER — ACETAMINOPHEN 325 MG PO TABS: 650.0000 mg | ORAL_TABLET | Freq: Once | ORAL | Status: DC

## 2024-03-01 MED ORDER — SODIUM CHLORIDE 0.9 % IV BOLUS
250.0000 mL | Freq: Once | INTRAVENOUS | Status: AC
  Administered 2024-03-01: 250 mL via INTRAVENOUS
  Filled 2024-03-01: qty 250

## 2024-03-01 MED ORDER — DIPHENHYDRAMINE HCL 25 MG PO CAPS: 25.0000 mg | ORAL_CAPSULE | Freq: Once | ORAL | Status: DC

## 2024-03-01 MED ORDER — IRON SUCROSE 20 MG/ML IV SOLN
200.0000 mg | Freq: Once | INTRAVENOUS | Status: AC
  Administered 2024-03-01: 200 mg via INTRAVENOUS
  Filled 2024-03-01: qty 10

## 2024-03-01 NOTE — Progress Notes (Signed)
 Diagnosis: Acute Anemia  Provider:  Chilton Greathouse MD  Procedure: IV Push  IV Type: Peripheral, IV Location: L Antecubital  Venofer (Iron Sucrose), Dose: 200 mg  Post Infusion IV Care: Observation period completed and Peripheral IV Discontinued  Discharge: Condition: Good, Destination: Home . AVS Declined  Performed by:  Wyvonne Lenz, RN

## 2024-04-23 ENCOUNTER — Encounter: Payer: BC Managed Care – PPO | Admitting: Nurse Practitioner

## 2024-05-14 NOTE — Progress Notes (Signed)
 LILLETTE Kristeen JINNY Gladis, CMA,acting as a neurosurgeon for Taylor Ada, FNP.,have documented all relevant documentation on the behalf of Taylor Ada, FNP,as directed by  Taylor Ada, FNP while in the presence of Taylor Ada, FNP.  Subjective:    Patient ID: Taylor Vance , female    DOB: 01/20/1976 , 49 y.o.   MRN: [id number]  Chief Complaint  Patient presents with   Annual Exam    Patient presents today for HM, Patient reports compliance with medication. Patient denies any chest pain, SOB, or headaches.    Sinus Problem    Patient reports she has had issues with her sinus the past few weeks. She reports the sinus pressure caused a bump on her right eye. She reports taking OTC sinus medications.      HPI  Discussed the use of AI scribe software for clinical note transcription with the patient, who gave verbal consent to proceed.  History of Present Illness Taylor Vance is a 49 year old female who presents for an annual physical exam and follow-up on her hematological condition.  She has been receiving regular eye transfusions and monitoring for iron  deficiency anemia under the care of a hematologist. Her next appointment with the hematologist is in two weeks, and there is no current need for another transfusion.  She experiences prolonged menstrual cycles, currently on day 24, and has an ultrasound scheduled with her gynecologist next week. She has not had a hysterectomy or tubal ligation, and her husband has had a vasectomy.  She is concerned about weight gain. She attributes some of this to her age and has a history of difficulty with weight loss despite efforts such as running, HIIT, and weight lifting. She is a vegetarian for cholesterol management and struggles with maintaining a consistent eating schedule, often eating only once a day. Her exercise routine has been inconsistent since November, previously exercising three to four days a week. Her work as an librarian, academic involves frequent travel, contributing to her irregular eating habits. She describes her diet as healthy but her eating habits as 'all over the place', with no routine except for having tea in the morning.  Past Medical History:  Diagnosis Date   Anemia    Family history of colon cancer    Family history of pancreatic cancer    Family history of uterine cancer    GERD (gastroesophageal reflux disease)    Yeast infection      Family History  Problem Relation Age of Onset   Hyperlipidemia Mother    Heart disease Mother    Hypertension Mother    Alcohol abuse Mother    Diabetes Father    Hypertension Father    Alcohol abuse Father    Uterine cancer Sister 66   Pancreatic cancer Maternal Aunt 18   Colon cancer Paternal Aunt 57    Current Medications[1]   Allergies[2]    The patient states she uses vasectomy for birth control. Patient's last menstrual period was 04/22/2024. Negative for Dysmenorrhea and Negative for Menorrhagia. Negative for: breast discharge, breast lump(s), breast pain and breast self exam. Associated symptoms include abnormal vaginal bleeding. Pertinent negatives include abnormal bleeding (hematology), anxiety, decreased libido, depression, difficulty falling sleep, dyspareunia, history of infertility, nocturia, sexual dysfunction, sleep disturbances, urinary incontinence, urinary urgency, vaginal discharge and vaginal itching. Diet regular; she has no routine with her meals. She does a lot of travel with work. The patient states her exercise level is minimal - not since November,  prior ws 3-4 days a week.  The patient's tobacco use is: Tobacco Use History[3]. She has been exposed to passive smoke. The patient's alcohol use is:  Social History   Substance and Sexual Activity  Alcohol Use Yes   Alcohol/week: 1.0 standard drink of alcohol   Types: 1 Glasses of wine per week   Comment: occ   Additional information: Last pap Dec 2022, next  one scheduled for Dec 2025.    Review of Systems  Constitutional: Negative.   HENT: Negative.    Eyes: Negative.   Respiratory: Negative.    Cardiovascular: Negative.   Gastrointestinal: Negative.   Endocrine: Negative.   Genitourinary: Negative.   Musculoskeletal: Negative.   Skin: Negative.   Allergic/Immunologic: Negative.   Neurological: Negative.   Hematological: Negative.   Psychiatric/Behavioral: Negative.       Today's Vitals   05/15/24 1209  BP: 110/60  Pulse: 77  Temp: 97.8 F (36.6 C)  TempSrc: Oral  Weight: 219 lb (99.3 kg)  Height: 5' 6 (1.676 m)  PainSc: 0-No pain   Body mass index is 35.35 kg/m.  Wt Readings from Last 3 Encounters:  05/15/24 219 lb (99.3 kg)  03/01/24 220 lb 6.4 oz (100 kg)  02/28/24 220 lb 12.8 oz (100.2 kg)     Objective:  Physical Exam Vitals and nursing note reviewed.  Constitutional:      General: She is not in acute distress.    Appearance: Normal appearance. She is well-developed. She is obese.  HENT:     Head: Normocephalic and atraumatic.     Right Ear: Hearing, tympanic membrane, ear canal and external ear normal. There is no impacted cerumen.     Left Ear: Hearing, tympanic membrane, ear canal and external ear normal. There is no impacted cerumen.     Nose:     Right Sinus: Maxillary sinus tenderness present.     Left Sinus: Maxillary sinus tenderness present.     Mouth/Throat:     Mouth: Mucous membranes are moist.  Eyes:     General: Lids are normal.     Extraocular Movements: Extraocular movements intact.     Conjunctiva/sclera: Conjunctivae normal.     Pupils: Pupils are equal, round, and reactive to light.     Funduscopic exam:    Right eye: No papilledema.        Left eye: No papilledema.  Neck:     Thyroid: No thyroid mass.     Vascular: No carotid bruit.  Cardiovascular:     Rate and Rhythm: Normal rate and regular rhythm.     Pulses: Normal pulses.     Heart sounds: Normal heart sounds. No murmur  heard. Pulmonary:     Effort: Pulmonary effort is normal. No respiratory distress.     Breath sounds: Normal breath sounds. No wheezing.  Chest:     Chest wall: No mass.  Breasts:    Tanner Score is 5.     Right: Normal. No mass or tenderness.     Left: Normal. No mass or tenderness.  Abdominal:     General: Abdomen is flat. Bowel sounds are normal. There is no distension.     Palpations: Abdomen is soft.     Tenderness: There is no abdominal tenderness.  Genitourinary:    Rectum: Guaiac result negative.  Musculoskeletal:        General: No swelling or tenderness. Normal range of motion.     Cervical back: Full passive range of motion without  pain, normal range of motion and neck supple.     Right lower leg: No edema.     Left lower leg: No edema.  Lymphadenopathy:     Upper Body:     Right upper body: No supraclavicular, axillary or pectoral adenopathy.     Left upper body: No supraclavicular, axillary or pectoral adenopathy.  Skin:    General: Skin is warm and dry.     Capillary Refill: Capillary refill takes less than 2 seconds.  Neurological:     General: No focal deficit present.     Mental Status: She is alert and oriented to person, place, and time.     Cranial Nerves: No cranial nerve deficit.     Sensory: No sensory deficit.     Motor: No weakness.  Psychiatric:        Mood and Affect: Mood normal.        Behavior: Behavior normal.        Thought Content: Thought content normal.        Judgment: Judgment normal.      Assessment And Plan:     Encounter for annual health examination Assessment & Plan: Behavior modifications discussed and diet history reviewed.   Pt will continue to exercise regularly and modify diet with low GI, plant based foods and decrease intake of processed foods.  Recommend intake of daily multivitamin, Vitamin D, and calcium.  Recommend mammogram and colonoscopy for preventive screenings, as well as recommend immunizations that include  influenza, TDAP    Iron  deficiency anemia secondary to inadequate dietary iron  intake Assessment & Plan: Managed by hematologist with regular check-ups and transfusions as needed. Next appointment in two weeks. - Continue follow-up with hematologist in two weeks for routine check-up and level assessment.  Orders: -     CBC with Differential/Platelet -     CMP14+EGFR  Influenza vaccination declined -     TSH  Abnormal glucose Assessment & Plan: HgbA1c was slightly elevated in 2024, will recheck levels today   Encounter for lipid screening for cardiovascular disease -     Lipid panel  Encounter for screening for metabolic disorder -     Hemoglobin A1c  Encounter for screening -     Hepatitis B surface antibody,qualitative  Obesity (BMI 30-39.9) Assessment & Plan: Significant weight gain since December 2024 due to irregular eating habits and lack of exercise. Previous weight loss achieved through running. Current regimen includes Stairmaster and weight lifting without significant results. Discussed benefits of more cardio and structured meals. Considered weight loss medications, but she prefers lifestyle changes. - Referred to [facility] for personalized diet and exercise plan. - Encouraged incorporation of cardio exercises such as walking, elliptical, or jump rope. - Advised on structured meal times with emphasis on protein and vegetables. - Discussed potential benefits of weight loss medications, but she prefers lifestyle changes.  Orders: -     TSH -     Amb Ref to [facility]  Acute non-recurrent maxillary sinusitis -     Amoxicillin -Pot Clavulanate; Take 1 tablet by mouth 2 (two) times daily.  Dispense: 14 tablet; Refill: 0     Return for 1 year physical. Patient was given opportunity to ask questions. Patient verbalized understanding of the plan and was able to repeat key elements of the plan. All questions were answered to their  satisfaction.   Taylor Ada, FNP  I, Taylor Ada, FNP, have reviewed all documentation for this visit. The documentation  on 05/15/24 for the exam, diagnosis, procedures, and orders are all accurate and complete.      [1]  Current Outpatient Medications:    amoxicillin -clavulanate (AUGMENTIN ) 875-125 MG tablet, Take 1 tablet by mouth 2 (two) times daily., Disp: 14 tablet, Rfl: 0   Multiple Vitamin (MULTIVITAMIN) tablet, Take 1 tablet by mouth daily., Disp: , Rfl:  [2] No Known Allergies [3]  Social History Tobacco Use  Smoking Status Never  Smokeless Tobacco Never

## 2024-05-15 ENCOUNTER — Ambulatory Visit: Payer: Self-pay | Admitting: Nurse Practitioner

## 2024-05-15 ENCOUNTER — Encounter: Payer: Self-pay | Admitting: Nurse Practitioner

## 2024-05-15 VITALS — BP 110/60 | HR 77 | Temp 97.8°F | Ht 66.0 in | Wt 219.0 lb

## 2024-05-15 DIAGNOSIS — Z13228 Encounter for screening for other metabolic disorders: Secondary | ICD-10-CM

## 2024-05-15 DIAGNOSIS — E669 Obesity, unspecified: Secondary | ICD-10-CM | POA: Diagnosis not present

## 2024-05-15 DIAGNOSIS — R7309 Other abnormal glucose: Secondary | ICD-10-CM | POA: Diagnosis not present

## 2024-05-15 DIAGNOSIS — J0100 Acute maxillary sinusitis, unspecified: Secondary | ICD-10-CM | POA: Diagnosis not present

## 2024-05-15 DIAGNOSIS — Z136 Encounter for screening for cardiovascular disorders: Secondary | ICD-10-CM | POA: Diagnosis not present

## 2024-05-15 DIAGNOSIS — Z13220 Encounter for screening for lipoid disorders: Secondary | ICD-10-CM | POA: Diagnosis not present

## 2024-05-15 DIAGNOSIS — Z139 Encounter for screening, unspecified: Secondary | ICD-10-CM | POA: Diagnosis not present

## 2024-05-15 DIAGNOSIS — D508 Other iron deficiency anemias: Secondary | ICD-10-CM

## 2024-05-15 DIAGNOSIS — Z0000 Encounter for general adult medical examination without abnormal findings: Secondary | ICD-10-CM

## 2024-05-15 DIAGNOSIS — Z2821 Immunization not carried out because of patient refusal: Secondary | ICD-10-CM

## 2024-05-15 DIAGNOSIS — E88819 Insulin resistance, unspecified: Secondary | ICD-10-CM

## 2024-05-15 MED ORDER — AMOXICILLIN-POT CLAVULANATE 875-125 MG PO TABS: 1.0000 | ORAL_TABLET | Freq: Two times a day (BID) | ORAL | 0 refills | Status: AC

## 2024-05-15 NOTE — Patient Instructions (Signed)
 Health Maintenance  Topic Date Due   Hepatitis C Screening  Never done   Hepatitis B Vaccine (1 of 3 - 19+ 3-dose series) Never done   Breast Cancer Screening  08/29/2021   COVID-19 Vaccine (3 - 2025-26 season) 05/31/2024*   Flu Shot  07/31/2024*   HIV Screening  05/15/2025*   Pap with HPV screening  04/06/2026   DTaP/Tdap/Td vaccine (2 - Td or Tdap) 06/03/2027   Colon Cancer Screening  05/28/2030   Pneumococcal Vaccine  Aged Out   HPV Vaccine  Aged Out   Meningitis B Vaccine  Aged Out  *Topic was postponed. The date shown is not the original due date.

## 2024-05-16 LAB — CBC WITH DIFFERENTIAL/PLATELET
Basophils Absolute: 0 x10E3/uL (ref 0.0–0.2)
Basos: 1 %
EOS (ABSOLUTE): 0.1 x10E3/uL (ref 0.0–0.4)
Eos: 1 %
Hematocrit: 41.8 % (ref 34.0–46.6)
Hemoglobin: 12.3 g/dL (ref 11.1–15.9)
Immature Grans (Abs): 0 x10E3/uL (ref 0.0–0.1)
Immature Granulocytes: 0 %
Lymphocytes Absolute: 2.2 x10E3/uL (ref 0.7–3.1)
Lymphs: 27 %
MCH: 23.4 pg — ABNORMAL LOW (ref 26.6–33.0)
MCHC: 29.4 g/dL — ABNORMAL LOW (ref 31.5–35.7)
MCV: 80 fL (ref 79–97)
Monocytes Absolute: 0.5 x10E3/uL (ref 0.1–0.9)
Monocytes: 7 %
Neutrophils Absolute: 5.4 x10E3/uL (ref 1.4–7.0)
Neutrophils: 64 %
Platelets: 424 x10E3/uL (ref 150–450)
RBC: 5.26 x10E6/uL (ref 3.77–5.28)
RDW: 15.1 % (ref 11.7–15.4)
WBC: 8.3 x10E3/uL (ref 3.4–10.8)

## 2024-05-16 LAB — LIPID PANEL
Chol/HDL Ratio: 3.1 ratio (ref 0.0–4.4)
Cholesterol, Total: 209 mg/dL — ABNORMAL HIGH (ref 100–199)
HDL: 68 mg/dL
LDL Chol Calc (NIH): 130 mg/dL — ABNORMAL HIGH (ref 0–99)
Triglycerides: 62 mg/dL (ref 0–149)
VLDL Cholesterol Cal: 11 mg/dL (ref 5–40)

## 2024-05-16 LAB — CMP14+EGFR
ALT: 20 IU/L (ref 0–32)
AST: 18 IU/L (ref 0–40)
Albumin: 4.4 g/dL (ref 3.9–4.9)
Alkaline Phosphatase: 114 IU/L (ref 41–116)
BUN/Creatinine Ratio: 10 (ref 9–23)
BUN: 6 mg/dL (ref 6–24)
Bilirubin Total: 0.2 mg/dL (ref 0.0–1.2)
CO2: 24 mmol/L (ref 20–29)
Calcium: 9.6 mg/dL (ref 8.7–10.2)
Chloride: 101 mmol/L (ref 96–106)
Creatinine, Ser: 0.62 mg/dL (ref 0.57–1.00)
Globulin, Total: 2.8 g/dL (ref 1.5–4.5)
Glucose: 82 mg/dL (ref 70–99)
Potassium: 4.8 mmol/L (ref 3.5–5.2)
Sodium: 139 mmol/L (ref 134–144)
Total Protein: 7.2 g/dL (ref 6.0–8.5)
eGFR: 109 mL/min/1.73

## 2024-05-16 LAB — TSH: TSH: 0.771 u[IU]/mL (ref 0.450–4.500)

## 2024-05-16 LAB — HEMOGLOBIN A1C
Est. average glucose Bld gHb Est-mCnc: 114 mg/dL
Hgb A1c MFr Bld: 5.6 % (ref 4.8–5.6)

## 2024-05-16 LAB — HEPATITIS B SURFACE ANTIBODY,QUALITATIVE: Hep B Surface Ab, Qual: NONREACTIVE

## 2024-05-27 ENCOUNTER — Ambulatory Visit: Payer: Self-pay | Admitting: Nurse Practitioner

## 2024-05-27 NOTE — Assessment & Plan Note (Signed)
 Significant weight gain since December 2024 due to irregular eating habits and lack of exercise. Previous weight loss achieved through running. Current regimen includes Stairmaster and weight lifting without significant results. Discussed benefits of more cardio and structured meals. Considered weight loss medications, but she prefers lifestyle changes. - Referred to [facility] for personalized diet and exercise plan. - Encouraged incorporation of cardio exercises such as walking, elliptical, or jump rope. - Advised on structured meal times with emphasis on protein and vegetables. - Discussed potential benefits of weight loss medications, but she prefers lifestyle changes.

## 2024-05-27 NOTE — Assessment & Plan Note (Signed)
 Managed by hematologist with regular check-ups and transfusions as needed. Next appointment in two weeks. - Continue follow-up with hematologist in two weeks for routine check-up and level assessment.

## 2024-05-27 NOTE — Assessment & Plan Note (Signed)
 HgbA1c was slightly elevated in 2024, will recheck levels today

## 2024-05-27 NOTE — Assessment & Plan Note (Signed)

## 2024-06-05 ENCOUNTER — Inpatient Hospital Stay

## 2024-06-06 ENCOUNTER — Inpatient Hospital Stay: Attending: Nurse Practitioner

## 2024-06-06 DIAGNOSIS — D649 Anemia, unspecified: Secondary | ICD-10-CM

## 2024-06-06 DIAGNOSIS — D508 Other iron deficiency anemias: Secondary | ICD-10-CM

## 2024-06-06 LAB — CBC WITH DIFFERENTIAL (CANCER CENTER ONLY)
Abs Immature Granulocytes: 0.01 10*3/uL (ref 0.00–0.07)
Basophils Absolute: 0 10*3/uL (ref 0.0–0.1)
Basophils Relative: 1 %
Eosinophils Absolute: 0.1 10*3/uL (ref 0.0–0.5)
Eosinophils Relative: 1 %
HCT: 31.1 % — ABNORMAL LOW (ref 36.0–46.0)
Hemoglobin: 9.7 g/dL — ABNORMAL LOW (ref 12.0–15.0)
Immature Granulocytes: 0 %
Lymphocytes Relative: 25 %
Lymphs Abs: 2.2 10*3/uL (ref 0.7–4.0)
MCH: 24.3 pg — ABNORMAL LOW (ref 26.0–34.0)
MCHC: 31.2 g/dL (ref 30.0–36.0)
MCV: 77.8 fL — ABNORMAL LOW (ref 80.0–100.0)
Monocytes Absolute: 0.4 10*3/uL (ref 0.1–1.0)
Monocytes Relative: 5 %
Neutro Abs: 6.1 10*3/uL (ref 1.7–7.7)
Neutrophils Relative %: 68 %
Platelet Count: 525 10*3/uL — ABNORMAL HIGH (ref 150–400)
RBC: 4 MIL/uL (ref 3.87–5.11)
RDW: 17 % — ABNORMAL HIGH (ref 11.5–15.5)
WBC Count: 8.9 10*3/uL (ref 4.0–10.5)
nRBC: 0 % (ref 0.0–0.2)

## 2024-06-06 LAB — IRON AND IRON BINDING CAPACITY (CC-WL,HP ONLY)
Iron: 25 ug/dL — ABNORMAL LOW (ref 28–170)
Saturation Ratios: 7 % — ABNORMAL LOW (ref 10.4–31.8)
TIBC: 382 ug/dL (ref 250–450)
UIBC: 357 ug/dL

## 2024-06-06 LAB — FERRITIN: Ferritin: 55 ng/mL (ref 11–307)

## 2024-06-06 LAB — VITAMIN B12: Vitamin B-12: 1021 pg/mL — ABNORMAL HIGH (ref 180–914)

## 2024-06-12 ENCOUNTER — Encounter: Admission: RE | Payer: Self-pay

## 2024-06-12 ENCOUNTER — Ambulatory Visit (HOSPITAL_COMMUNITY): Admission: RE | Admit: 2024-06-12 | Admitting: Obstetrics and Gynecology

## 2024-06-12 DIAGNOSIS — Z01818 Encounter for other preprocedural examination: Secondary | ICD-10-CM

## 2024-10-04 ENCOUNTER — Inpatient Hospital Stay: Admitting: Hematology

## 2024-10-04 ENCOUNTER — Inpatient Hospital Stay: Attending: Nurse Practitioner

## 2025-05-16 ENCOUNTER — Encounter: Payer: Self-pay | Admitting: Nurse Practitioner
